# Patient Record
Sex: Female | Born: 1985 | Hispanic: No | Marital: Single | Smoking: Current every day smoker
Health system: Southern US, Community
[De-identification: ages and names within clinical notes are randomized; demographics above are authoritative.]

## PROBLEM LIST (undated history)

## (undated) ENCOUNTER — Inpatient Hospital Stay: Payer: Self-pay

## (undated) DIAGNOSIS — N83209 Unspecified ovarian cyst, unspecified side: Secondary | ICD-10-CM

## (undated) DIAGNOSIS — F319 Bipolar disorder, unspecified: Secondary | ICD-10-CM

## (undated) DIAGNOSIS — F209 Schizophrenia, unspecified: Secondary | ICD-10-CM

## (undated) DIAGNOSIS — F419 Anxiety disorder, unspecified: Secondary | ICD-10-CM

## (undated) DIAGNOSIS — D649 Anemia, unspecified: Secondary | ICD-10-CM

## (undated) DIAGNOSIS — A630 Anogenital (venereal) warts: Secondary | ICD-10-CM

---

## 2015-12-02 ENCOUNTER — Encounter: Payer: Self-pay | Admitting: Emergency Medicine

## 2015-12-02 ENCOUNTER — Emergency Department: Payer: Medicaid Other

## 2015-12-02 DIAGNOSIS — O99332 Smoking (tobacco) complicating pregnancy, second trimester: Secondary | ICD-10-CM | POA: Insufficient documentation

## 2015-12-02 DIAGNOSIS — O209 Hemorrhage in early pregnancy, unspecified: Secondary | ICD-10-CM | POA: Diagnosis present

## 2015-12-02 DIAGNOSIS — F17200 Nicotine dependence, unspecified, uncomplicated: Secondary | ICD-10-CM | POA: Diagnosis not present

## 2015-12-02 DIAGNOSIS — Z3A19 19 weeks gestation of pregnancy: Secondary | ICD-10-CM | POA: Diagnosis not present

## 2015-12-02 LAB — CBC WITH DIFFERENTIAL/PLATELET
BASOS ABS: 0.1 10*3/uL (ref 0–0.1)
BASOS PCT: 1 %
EOS ABS: 0.1 10*3/uL (ref 0–0.7)
Eosinophils Relative: 1 %
HEMATOCRIT: 32.6 % — AB (ref 35.0–47.0)
HEMOGLOBIN: 11 g/dL — AB (ref 12.0–16.0)
Lymphocytes Relative: 16 %
Lymphs Abs: 2.3 10*3/uL (ref 1.0–3.6)
MCH: 28.7 pg (ref 26.0–34.0)
MCHC: 33.7 g/dL (ref 32.0–36.0)
MCV: 85.1 fL (ref 80.0–100.0)
Monocytes Absolute: 0.7 10*3/uL (ref 0.2–0.9)
Monocytes Relative: 5 %
NEUTROS ABS: 11.6 10*3/uL — AB (ref 1.4–6.5)
NEUTROS PCT: 79 %
Platelets: 271 10*3/uL (ref 150–440)
RBC: 3.83 MIL/uL (ref 3.80–5.20)
RDW: 16.3 % — ABNORMAL HIGH (ref 11.5–14.5)
WBC: 14.7 10*3/uL — AB (ref 3.6–11.0)

## 2015-12-02 NOTE — ED Notes (Signed)
Pt arrived to the ED accompanied by a friend, for complaints of spotting and cramping. Pt reports that she is [redacted] weeks pregnant with complications; pt had previous cramping and bleeding with this pregnancy. Pt states that she has a history of miscarriages (6). Pt is teary and sad in triage.

## 2015-12-03 ENCOUNTER — Emergency Department
Admission: EM | Admit: 2015-12-03 | Discharge: 2015-12-03 | Disposition: A | Discharge status: Home / Self Care | Payer: Medicaid Other | Attending: Emergency Medicine | Admitting: Emergency Medicine

## 2015-12-03 DIAGNOSIS — N939 Abnormal uterine and vaginal bleeding, unspecified: Secondary | ICD-10-CM

## 2015-12-03 DIAGNOSIS — O4692 Antepartum hemorrhage, unspecified, second trimester: Secondary | ICD-10-CM

## 2015-12-03 HISTORY — DX: Anemia, unspecified: D64.9

## 2015-12-03 HISTORY — DX: Bipolar disorder, unspecified: F31.9

## 2015-12-03 HISTORY — DX: Anogenital (venereal) warts: A63.0

## 2015-12-03 HISTORY — DX: Schizophrenia, unspecified: F20.9

## 2015-12-03 HISTORY — DX: Anxiety disorder, unspecified: F41.9

## 2015-12-03 HISTORY — DX: Unspecified ovarian cyst, unspecified side: N83.209

## 2015-12-03 LAB — URINALYSIS COMPLETE WITH MICROSCOPIC (ARMC ONLY)
Bilirubin Urine: NEGATIVE
Glucose, UA: NEGATIVE mg/dL
Hgb urine dipstick: NEGATIVE
Ketones, ur: NEGATIVE mg/dL
Nitrite: NEGATIVE
Protein, ur: NEGATIVE mg/dL
Specific Gravity, Urine: 1.01 (ref 1.005–1.030)
pH: 7 (ref 5.0–8.0)

## 2015-12-03 LAB — HCG, QUANTITATIVE, PREGNANCY: HCG, BETA CHAIN, QUANT, S: 8387 m[IU]/mL — AB (ref <5)

## 2015-12-03 LAB — ABO/RH: ABO/RH(D): O POS

## 2015-12-03 MED ORDER — ACETAMINOPHEN 325 MG PO TABS
650.0000 mg | ORAL_TABLET | Freq: Once | ORAL | Status: AC
  Administered 2015-12-03: 650 mg via ORAL
  Filled 2015-12-03: qty 2

## 2015-12-03 NOTE — ED Provider Notes (Signed)
[facility] Emergency Department Provider Note  ____________________________________________  Time seen: 3:15 AM  I have reviewed the triage vital signs and the nursing notes.   HISTORY  Chief Complaint Vaginal Bleeding and Threatened Miscarriage     HPI Christina Hansen is a 30 y.o. female G 10 P3 presents with vaginal spotting and pelvic cramping 3 days. [facility] she missed her last appointment on Monday. Patient denies any fever no dysuria    Past Medical History  Diagnosis Date  . Anemia   . Ovarian cyst   . Anxiety   . Bipolar 1 disorder (HCC)   . Schizophrenia (HCC)   . HPV (human papilloma virus) anogenital infection     There are no active problems to display for this patient.   History reviewed. No pertinent past surgical history.  No current outpatient prescriptions on file.  Allergies Wool alcohol and Peanut-containing drug products  History reviewed. No pertinent family history.  Social History Social History  Substance Use Topics  . Smoking status: Current Every Day Smoker  . Smokeless tobacco: None  . Alcohol Use: Yes    Review of Systems  Constitutional: Negative for fever. Eyes: Negative for visual changes. ENT: Negative for sore throat. Cardiovascular: Negative for chest pain. Respiratory: Negative for shortness of breath. Gastrointestinal: Negative for abdominal pain, vomiting and diarrhea. Genitourinary: Negative for dysuria. Positive for pelvic pain and vaginal spotting Musculoskeletal: Negative for back pain. Skin: Negative for rash. Neurological: Negative for headaches, focal weakness or numbness.   10-point ROS otherwise negative.  ____________________________________________   PHYSICAL EXAM:  VITAL SIGNS: ED Triage Vitals  Enc Vitals Group     BP 12/02/15 2254 124/78 mmHg     Pulse Rate 12/02/15 2254 97     Resp 12/02/15 2254 18     Temp 12/02/15 2254 98.2 F (36.8  C)     Temp Source 12/02/15 2254 Oral     SpO2 12/02/15 2254 97 %     Weight 12/02/15 2254 193 lb (87.544 kg)     Height 12/02/15 2254 5\' 3"  (1.6 m)     Head Cir --      Peak Flow --      Pain Score 12/02/15 2255 6     Pain Loc --      Pain Edu? --      Excl. in GC? --      Constitutional: Alert and oriented. Well appearing and in no distress. Eyes: Conjunctivae are normal. PERRL. Normal extraocular movements. ENT   Head: Normocephalic and atraumatic.   Nose: No congestion/rhinnorhea.   Mouth/Throat: Mucous membranes are moist.   Neck: No stridor. Hematological/Lymphatic/Immunilogical: No cervical lymphadenopathy. Cardiovascular: Normal rate, regular rhythm. Normal and symmetric distal pulses are present in all extremities. No murmurs, rubs, or gallops. Respiratory: Normal respiratory effort without tachypnea nor retractions. Breath sounds are clear and equal bilaterally. No wheezes/rales/rhonchi. Gastrointestinal: Soft and nontender. No distention. There is no CVA tenderness. Genitourinary: deferred Musculoskeletal: Nontender with normal range of motion in all extremities. No joint effusions.  No lower extremity tenderness nor edema. Neurologic:  Normal speech and language. No gross focal neurologic deficits are appreciated. Speech is normal.  Skin:  Skin is warm, dry and intact. No rash noted. Psychiatric: Mood and affect are normal. Speech and behavior are normal. Patient exhibits appropriate insight and judgment.  ____________________________________________    LABS (pertinent positives/negatives)  Labs Reviewed  HCG, QUANTITATIVE, PREGNANCY - Abnormal; Notable for the following:  hCG, Beta Chain, Quant, S 8387 (*)    All other components within normal limits  CBC WITH DIFFERENTIAL/PLATELET - Abnormal; Notable for the following:    WBC 14.7 (*)    Hemoglobin 11.0 (*)    HCT 32.6 (*)    RDW 16.3 (*)    Neutro Abs 11.6 (*)    All other components within  normal limits  URINALYSIS COMPLETEWITH MICROSCOPIC ([facility] ONLY) - Abnormal; Notable for the following:    Color, Urine YELLOW (*)    APPearance HAZY (*)    Leukocytes, UA 1+ (*)    Bacteria, UA FEW (*)    Squamous Epithelial / LPF 0-5 (*)    All other components within normal limits  ABO/RH      RADIOLOGY  US OB Limited (Final result) Result time: 12/02/15 23:57:31   Procedure changed from US Ob Comp + 14 Wk      Final result by Rad Results In Interface (12/02/15 23:57:31)   Narrative:   CLINICAL DATA: Pelvic cramping for 2 weeks. Vaginal bleeding.  EXAM: LIMITED OBSTETRIC ULTRASOUND  FINDINGS: Number of Fetuses: 1  Heart Rate: 155 bpm  Movement: Yes  Presentation: Transverse, head maternal right  Placental Location: Anterior fundal.  Previa: No  Amniotic Fluid (Subjective): Within normal limits.  BPD: 4.2cm 18w 6d  MATERNAL FINDINGS:  Cervix: Appears closed.  Uterus/Adnexae: No abnormality visualized.  IMPRESSION: Single live intrauterine pregnancy estimated gestational age [redacted] weeks 6 day for estimated date of delivery 04/28/2016.  This exam is performed on an emergent basis and does not comprehensively evaluate fetal size, dating, or anatomy; follow-up complete OB US should be considered if further fetal assessment is warranted.   Electronically Signed By: Rubye Oaks M.D. On: 12/02/2015 23:57        INITIAL IMPRESSION / ASSESSMENT AND PLAN / ED COURSE  Pertinent labs & imaging results that were available during my care of the patient were reviewed by me and considered in my medical decision making (see chart for details).   patient's blood type O positive.   ____________________________________________   FINAL CLINICAL IMPRESSION(S) / ED DIAGNOSES  Final diagnoses:  Vaginal bleeding in pregnancy, second trimester      Darci Current, MD 12/03/15 [phone]

## 2015-12-03 NOTE — Discharge Instructions (Signed)
Vaginal Bleeding During Pregnancy, Second Trimester A small amount of bleeding (spotting) from the vagina is relatively common in pregnancy. It usually stops on its own. Various things can cause bleeding or spotting in pregnancy. Some bleeding may be related to the pregnancy, and some may not. Sometimes the bleeding is normal and is not a problem. However, bleeding can also be a sign of something serious. Be sure to tell your health care provider about any vaginal bleeding right away. Some possible causes of vaginal bleeding during the second trimester include:  Infection, inflammation, or growths on the cervix.   The placenta may be partially or completely covering the opening of the cervix inside the uterus (placenta previa).  The placenta may have separated from the uterus (abruption of the placenta).   You may be having early (preterm) labor.   The cervix may not be strong enough to keep a baby inside the uterus (cervical insufficiency).   Tiny cysts may have developed in the uterus instead of pregnancy tissue (molar pregnancy). HOME CARE INSTRUCTIONS  Watch your condition for any changes. The following actions may help to lessen any discomfort you are feeling:  Follow your health care provider's instructions for limiting your activity. If your health care provider orders bed rest, you may need to stay in bed and only get up to use the bathroom. However, your health care provider may allow you to continue light activity.  If needed, make plans for someone to help with your regular activities and responsibilities while you are on bed rest.  Keep track of the number of pads you use each day, how often you change pads, and how soaked (saturated) they are. Write this down.  Do not use tampons. Do not douche.  Do not have sexual intercourse or orgasms until approved by your health care provider.  If you pass any tissue from your vagina, save the tissue so you can show it to your  health care provider.  Only take over-the-counter or prescription medicines as directed by your health care provider.  Do not take aspirin because it can make you bleed.  Do not exercise or perform any strenuous activities or heavy lifting without your health care provider's permission.  Keep all follow-up appointments as directed by your health care provider. SEEK MEDICAL CARE IF:  You have any vaginal bleeding during any part of your pregnancy.  You have cramps or labor pains.  You have a fever, not controlled by medicine. SEEK IMMEDIATE MEDICAL CARE IF:   You have severe cramps in your back or belly (abdomen).  You have contractions.  You have chills.  You pass large clots or tissue from your vagina.  Your bleeding increases.  You feel light-headed or weak, or you have fainting episodes.  You are leaking fluid or have a gush of fluid from your vagina. MAKE SURE YOU:  Understand these instructions.  Will watch your condition.  Will get help right away if you are not doing well or get worse.   This information is not intended to replace advice given to you by your health care provider. Make sure you discuss any questions you have with your health care provider.   Document Released: 08/25/2005 Document Revised: 11/20/2013 Document Reviewed: 07/23/2013 Elsevier Interactive Patient Education 2016 Elsevier Inc.  

## 2015-12-05 ENCOUNTER — Ambulatory Visit
Admission: RE | Admit: 2015-12-05 | Discharge: 2015-12-05 | Disposition: A | Discharge status: Home / Self Care | Payer: Medicaid Other | Source: Ambulatory Visit | Attending: Advanced Practice Midwife | Admitting: Advanced Practice Midwife

## 2015-12-05 ENCOUNTER — Other Ambulatory Visit: Payer: Self-pay | Admitting: Advanced Practice Midwife

## 2015-12-05 DIAGNOSIS — R1032 Left lower quadrant pain: Secondary | ICD-10-CM | POA: Diagnosis present

## 2015-12-05 DIAGNOSIS — K760 Fatty (change of) liver, not elsewhere classified: Secondary | ICD-10-CM | POA: Diagnosis not present

## 2015-12-05 DIAGNOSIS — Z3A19 19 weeks gestation of pregnancy: Secondary | ICD-10-CM | POA: Diagnosis not present

## 2015-12-05 DIAGNOSIS — O26892 Other specified pregnancy related conditions, second trimester: Secondary | ICD-10-CM | POA: Insufficient documentation

## 2015-12-19 ENCOUNTER — Observation Stay
Admission: EM | Admit: 2015-12-19 | Discharge: 2015-12-20 | Disposition: A | Discharge status: Home / Self Care | Payer: Medicaid Other | Attending: Obstetrics & Gynecology | Admitting: Obstetrics & Gynecology

## 2015-12-19 ENCOUNTER — Observation Stay: Payer: Medicaid Other

## 2015-12-19 DIAGNOSIS — O99334 Smoking (tobacco) complicating childbirth: Secondary | ICD-10-CM | POA: Insufficient documentation

## 2015-12-19 DIAGNOSIS — O99212 Obesity complicating pregnancy, second trimester: Secondary | ICD-10-CM | POA: Insufficient documentation

## 2015-12-19 DIAGNOSIS — Z91048 Other nonmedicinal substance allergy status: Secondary | ICD-10-CM | POA: Diagnosis not present

## 2015-12-19 DIAGNOSIS — Z888 Allergy status to other drugs, medicaments and biological substances status: Secondary | ICD-10-CM | POA: Diagnosis not present

## 2015-12-19 DIAGNOSIS — E669 Obesity, unspecified: Secondary | ICD-10-CM | POA: Diagnosis not present

## 2015-12-19 DIAGNOSIS — O26612 Liver and biliary tract disorders in pregnancy, second trimester: Secondary | ICD-10-CM | POA: Insufficient documentation

## 2015-12-19 DIAGNOSIS — R102 Pelvic and perineal pain: Secondary | ICD-10-CM | POA: Insufficient documentation

## 2015-12-19 DIAGNOSIS — F17210 Nicotine dependence, cigarettes, uncomplicated: Secondary | ICD-10-CM | POA: Insufficient documentation

## 2015-12-19 DIAGNOSIS — Z3A21 21 weeks gestation of pregnancy: Secondary | ICD-10-CM | POA: Insufficient documentation

## 2015-12-19 DIAGNOSIS — K760 Fatty (change of) liver, not elsewhere classified: Secondary | ICD-10-CM | POA: Diagnosis not present

## 2015-12-19 DIAGNOSIS — O26892 Other specified pregnancy related conditions, second trimester: Principal | ICD-10-CM | POA: Insufficient documentation

## 2015-12-19 DIAGNOSIS — R309 Painful micturition, unspecified: Secondary | ICD-10-CM

## 2015-12-19 LAB — URINALYSIS COMPLETE WITH MICROSCOPIC (ARMC ONLY)
Bilirubin Urine: NEGATIVE
GLUCOSE, UA: NEGATIVE mg/dL
Hgb urine dipstick: NEGATIVE
Ketones, ur: NEGATIVE mg/dL
NITRITE: NEGATIVE
Protein, ur: NEGATIVE mg/dL
Specific Gravity, Urine: 1.005 (ref 1.005–1.030)
pH: 7 (ref 5.0–8.0)

## 2015-12-19 MED ORDER — ACETAMINOPHEN 325 MG PO TABS
650.0000 mg | ORAL_TABLET | ORAL | Status: DC | PRN
  Administered 2015-12-19: 650 mg via ORAL
  Filled 2015-12-19: qty 2

## 2015-12-19 MED ORDER — OXYBUTYNIN CHLORIDE ER 5 MG PO TB24
5.0000 mg | ORAL_TABLET | Freq: Every day | ORAL | Status: DC
  Administered 2015-12-19: 5 mg via ORAL
  Filled 2015-12-19 (×2): qty 1

## 2015-12-19 MED ORDER — OXYBUTYNIN CHLORIDE ER 5 MG PO TB24: 5.0000 mg | ORAL_TABLET | Freq: Every day | ORAL | Status: DC

## 2015-12-19 MED ORDER — OXYCODONE-ACETAMINOPHEN 5-325 MG PO TABS
1.0000 | ORAL_TABLET | ORAL | Status: DC | PRN
  Administered 2015-12-19 – 2015-12-20 (×2): 1 via ORAL
  Filled 2015-12-19 (×2): qty 1

## 2015-12-19 NOTE — Plan of Care (Signed)
Pt presents to l/d with c/o pink spotting and has gotten progressively worse over the last month. Dr ward aware that pt is on the unit.

## 2015-12-20 DIAGNOSIS — O26892 Other specified pregnancy related conditions, second trimester: Secondary | ICD-10-CM | POA: Diagnosis not present

## 2015-12-20 MED ORDER — OXYCODONE-ACETAMINOPHEN 5-325 MG PO TABS: 1.0000 | ORAL_TABLET | Freq: Four times a day (QID) | ORAL | Status: AC | PRN

## 2015-12-20 NOTE — Discharge Summary (Signed)
Christina Hansen is a 30 y.o. female. She is at [redacted]w[redacted]d gestation.  Indication: Christina Hansen is a [id number] who presents again to triage with continued complaints of pelvic pain.  As she describes it, it is along the front of the pelvis, bilaterally, and traces laterally, up her flank, and ends in her mid-back on both sides.  She also complains of a spot of blood.  She has been evaluated for this for over a month, and has had continuous negative urine cultures, s/p antibiotic treatment, s/p renal and obstetric ultrasounds, and is thus far without any pertinent findings.   She does not know if her bleeding is from her vagina or urethra.  She further describes pain with urination, feeling that when her bladder empties she feels like her bladder is "bruised".    Pregnancy issues:  1. Obesity 2. Smoker 3. Fatty liver (seen on ultrasound) 4. Chronic urinary system pain 5. FOB is Hep B positive, patient initial Hep B Ag neg  S: Resting comfortably. no CTX, no VB. Active fetal movement.  O:  BP 120/73 mmHg  Pulse 88  Temp(Src) 98.4 F (36.9 C)  LMP 07/21/2015 (Approximate) Results for orders placed or performed during the hospital encounter of 12/19/15 (from the past 48 hour(s))  Urinalysis complete, with microscopic [facility] only)   Collection Time: 12/19/15  8:08 PM  Result Value Ref Range   Color, Urine YELLOW (A) YELLOW   APPearance HAZY (A) CLEAR   Glucose, UA NEGATIVE NEGATIVE mg/dL   Bilirubin Urine NEGATIVE NEGATIVE   Ketones, ur NEGATIVE NEGATIVE mg/dL   Specific Gravity, Urine 1.005 1.005 - 1.030   Hgb urine dipstick NEGATIVE NEGATIVE   pH 7.0 5.0 - 8.0   Protein, ur NEGATIVE NEGATIVE mg/dL   Nitrite NEGATIVE NEGATIVE   Leukocytes, UA 3+ (A) NEGATIVE   RBC / HPF 0-5 0 - 5 RBC/hpf   WBC, UA 0-5 0 - 5 WBC/hpf   Bacteria, UA MANY (A) NONE SEEN   Squamous Epithelial / LPF 0-5 (A) NONE SEEN   Mucous PRESENT      Gen: NAD, AAOx3      Abd: FNTTP      Ext: Non-tender, Nonedmeatous Back:   CVA ttp on RIGHT Pelvic: SSE: copious thick white discharge, no evidence of bleeding.  Cervix without lesions.  External os 1cm visually.  No caruncle or lesion on urethra.  No hemorrhoids or lesions on anus.  SVE: TTP bilateral lateral bladder and at site of UOs.  Dome of bladder non-tender.  Cervix non-tender.  External cervical os dilated, able to pass a finger, but unable to palpate internal os.  Long and firm.      FHT: present and WNL TOCO: quiet SVE: external os multiparous, endocervical canal patent, but internal os closed.   A/P:  [id number] with chronic, unidentified GU pain, and [redacted] weeks pregnant   Labor: not present.   Had a repeat anatomy scan yesterday; placenta is fundal and not associated with the bladder.  Test for funneling not performed, no comments on cervix.  Urine was strained overnight and no precipitate noted.  Renal ultrasound shows no hydronephrosis, bladder distortion, no abnormal findings.  Ditropan given with no change in symptoms  Percocet masks pain and gives relief.  Rx'd #45 5/325mg .    Will refer to urology for help in management/identification of source.  D/c home stable, precautions reviewed, follow-up as scheduled.   Christina Hansen, Elenora Fender

## 2016-02-19 ENCOUNTER — Emergency Department (HOSPITAL_COMMUNITY)
Admission: EM | Admit: 2016-02-19 | Discharge: 2016-02-19 | Disposition: A | Discharge status: Home / Self Care | Payer: Medicaid Other | Attending: Emergency Medicine | Admitting: Emergency Medicine

## 2016-02-19 ENCOUNTER — Emergency Department (HOSPITAL_COMMUNITY): Payer: Medicaid Other

## 2016-02-19 ENCOUNTER — Encounter (HOSPITAL_COMMUNITY): Payer: Self-pay

## 2016-02-19 DIAGNOSIS — O99333 Smoking (tobacco) complicating pregnancy, third trimester: Secondary | ICD-10-CM | POA: Insufficient documentation

## 2016-02-19 DIAGNOSIS — Z3A28 28 weeks gestation of pregnancy: Secondary | ICD-10-CM | POA: Diagnosis not present

## 2016-02-19 DIAGNOSIS — F17200 Nicotine dependence, unspecified, uncomplicated: Secondary | ICD-10-CM | POA: Insufficient documentation

## 2016-02-19 DIAGNOSIS — F319 Bipolar disorder, unspecified: Secondary | ICD-10-CM | POA: Diagnosis not present

## 2016-02-19 DIAGNOSIS — O99513 Diseases of the respiratory system complicating pregnancy, third trimester: Secondary | ICD-10-CM | POA: Diagnosis not present

## 2016-02-19 DIAGNOSIS — J069 Acute upper respiratory infection, unspecified: Secondary | ICD-10-CM

## 2016-02-19 MED ORDER — DEXTROMETHORPHAN POLISTIREX ER 30 MG/5ML PO SUER
30.0000 mg | Freq: Once | ORAL | Status: AC
  Administered 2016-02-19: 30 mg via ORAL
  Filled 2016-02-19: qty 5

## 2016-02-19 MED ORDER — ACETAMINOPHEN 500 MG PO TABS
1000.0000 mg | ORAL_TABLET | Freq: Once | ORAL | Status: AC
  Administered 2016-02-19: 1000 mg via ORAL
  Filled 2016-02-19: qty 2

## 2016-02-19 MED ORDER — GUAIFENESIN 200 MG PO TABS: 200.0000 mg | ORAL_TABLET | ORAL | Status: DC | PRN

## 2016-02-19 NOTE — ED Notes (Signed)
Pt given cup of water and encouraged to drink;  

## 2016-02-19 NOTE — ED Notes (Signed)
CIWA scale performed on wrong pt; assessment corrected and CIWA performed on correct patient

## 2016-02-19 NOTE — Discharge Instructions (Signed)
You may take Tylenol 1000 mg every 6 hours as needed for pain and fever.   Upper Respiratory Infection, Adult Most upper respiratory infections (URIs) are a viral infection of the air passages leading to the lungs. A URI affects the nose, throat, and upper air passages. The most common type of URI is nasopharyngitis and is typically referred to as "the common cold." URIs run their course and usually go away on their own. Most of the time, a URI does not require medical attention, but sometimes a bacterial infection in the upper airways can follow a viral infection. This is called a secondary infection. Sinus and middle ear infections are common types of secondary upper respiratory infections. Bacterial pneumonia can also complicate a URI. A URI can worsen asthma and chronic obstructive pulmonary disease (COPD). Sometimes, these complications can require emergency medical care and may be life threatening.  CAUSES Almost all URIs are caused by viruses. A virus is a type of germ and can spread from one person to another.  RISKS FACTORS You may be at risk for a URI if:   You smoke.   You have chronic heart or lung disease.  You have a weakened defense (immune) system.   You are very young or very old.   You have nasal allergies or asthma.  You work in crowded or poorly ventilated areas.  You work in health care facilities or schools. SIGNS AND SYMPTOMS  Symptoms typically develop 2-3 days after you come in contact with a cold virus. Most viral URIs last 7-10 days. However, viral URIs from the influenza virus (flu virus) can last 14-18 days and are typically more severe. Symptoms may include:   Runny or stuffy (congested) nose.   Sneezing.   Cough.   Sore throat.   Headache.   Fatigue.   Fever.   Loss of appetite.   Pain in your forehead, behind your eyes, and over your cheekbones (sinus pain).  Muscle aches.  DIAGNOSIS  Your health care provider may diagnose a  URI by:  Physical exam.  Tests to check that your symptoms are not due to another condition such as:  Strep throat.  Sinusitis.  Pneumonia.  Asthma. TREATMENT  A URI goes away on its own with time. It cannot be cured with medicines, but medicines may be prescribed or recommended to relieve symptoms. Medicines may help:  Reduce your fever.  Reduce your cough.  Relieve nasal congestion. HOME CARE INSTRUCTIONS   Take medicines only as directed by your health care provider.   Gargle warm saltwater or take cough drops to comfort your throat as directed by your health care provider.  Use a warm mist humidifier or inhale steam from a shower to increase air moisture. This may make it easier to breathe.  Drink enough fluid to keep your urine clear or pale yellow.   Eat soups and other clear broths and maintain good nutrition.   Rest as needed.   Return to work when your temperature has returned to normal or as your health care provider advises. You may need to stay home longer to avoid infecting others. You can also use a face mask and careful hand washing to prevent spread of the virus.  Increase the usage of your inhaler if you have asthma.   Do not use any tobacco products, including cigarettes, chewing tobacco, or electronic cigarettes. If you need help quitting, ask your health care provider. PREVENTION  The best way to protect yourself from getting a  cold is to practice good hygiene.   Avoid oral or hand contact with people with cold symptoms.   Wash your hands often if contact occurs.  There is no clear evidence that vitamin C, vitamin E, echinacea, or exercise reduces the chance of developing a cold. However, it is always recommended to get plenty of rest, exercise, and practice good nutrition.  SEEK MEDICAL CARE IF:   You are getting worse rather than better.   Your symptoms are not controlled by medicine.   You have chills.  You have worsening  shortness of breath.  You have brown or red mucus.  You have yellow or brown nasal discharge.  You have pain in your face, especially when you bend forward.  You have a fever.  You have swollen neck glands.  You have pain while swallowing.  You have white areas in the back of your throat. SEEK IMMEDIATE MEDICAL CARE IF:   You have severe or persistent:  Headache.  Ear pain.  Sinus pain.  Chest pain.  You have chronic lung disease and any of the following:  Wheezing.  Prolonged cough.  Coughing up blood.  A change in your usual mucus.  You have a stiff neck.  You have changes in your:  Vision.  Hearing.  Thinking.  Mood. MAKE SURE YOU:   Understand these instructions.  Will watch your condition.  Will get help right away if you are not doing well or get worse.   This information is not intended to replace advice given to you by your health care provider. Make sure you discuss any questions you have with your health care provider.   Document Released: 05/11/2001 Document Revised: 04/01/2015 Document Reviewed: 02/20/2014 Elsevier Interactive Patient Education [employer]2016 Elsevier Inc.

## 2016-02-19 NOTE — ED Notes (Signed)
Cough onset yesterday, also c/o headache.

## 2016-02-19 NOTE — ED Provider Notes (Signed)
74  and manner are appropriate. Grooming and personal hygiene are appropriate.  MEDICAL DECISION MAKING: Patient here with likely viral illness. Chest x-ray shows no infiltrate. Patient feels better after Tylenol, extra before feeding. Discussed with patient that a other medications aren't safe in  pregnancy. Do not feel she needs to be on antibiotics at this time. She is drinking without difficulty. Have recommended supportive care instructions, rest, increase fluid intake. Discussed return precautions. She verbalizes understanding and is comfortable with this plan.     Patient had an erroneous heart rate documented. Heart rate of 127 did not belong to this patient.    Layla MawKristen N Tahjae Clausing, DO 02/19/16 [phone]

## 2016-03-16 ENCOUNTER — Encounter (HOSPITAL_COMMUNITY): Payer: Self-pay | Admitting: *Deleted

## 2016-03-16 ENCOUNTER — Emergency Department (HOSPITAL_COMMUNITY)
Admission: EM | Admit: 2016-03-16 | Discharge: 2016-03-17 | Disposition: A | Discharge status: Home / Self Care | Payer: Medicaid Other | Attending: Emergency Medicine | Admitting: Emergency Medicine

## 2016-03-16 DIAGNOSIS — O26893 Other specified pregnancy related conditions, third trimester: Secondary | ICD-10-CM | POA: Diagnosis present

## 2016-03-16 DIAGNOSIS — F319 Bipolar disorder, unspecified: Secondary | ICD-10-CM | POA: Diagnosis not present

## 2016-03-16 DIAGNOSIS — B3731 Acute candidiasis of vulva and vagina: Secondary | ICD-10-CM

## 2016-03-16 DIAGNOSIS — Z3A34 34 weeks gestation of pregnancy: Secondary | ICD-10-CM | POA: Insufficient documentation

## 2016-03-16 DIAGNOSIS — O26899 Other specified pregnancy related conditions, unspecified trimester: Secondary | ICD-10-CM

## 2016-03-16 DIAGNOSIS — F209 Schizophrenia, unspecified: Secondary | ICD-10-CM | POA: Insufficient documentation

## 2016-03-16 DIAGNOSIS — B9689 Other specified bacterial agents as the cause of diseases classified elsewhere: Secondary | ICD-10-CM

## 2016-03-16 DIAGNOSIS — N760 Acute vaginitis: Secondary | ICD-10-CM

## 2016-03-16 DIAGNOSIS — R1030 Lower abdominal pain, unspecified: Secondary | ICD-10-CM | POA: Diagnosis not present

## 2016-03-16 DIAGNOSIS — R109 Unspecified abdominal pain: Secondary | ICD-10-CM

## 2016-03-16 DIAGNOSIS — F17200 Nicotine dependence, unspecified, uncomplicated: Secondary | ICD-10-CM | POA: Diagnosis not present

## 2016-03-16 DIAGNOSIS — R809 Proteinuria, unspecified: Secondary | ICD-10-CM

## 2016-03-16 DIAGNOSIS — B373 Candidiasis of vulva and vagina: Secondary | ICD-10-CM

## 2016-03-16 LAB — URINALYSIS, ROUTINE W REFLEX MICROSCOPIC
BILIRUBIN URINE: NEGATIVE
Glucose, UA: NEGATIVE mg/dL
Hgb urine dipstick: NEGATIVE
KETONES UR: NEGATIVE mg/dL
Leukocytes, UA: NEGATIVE
NITRITE: NEGATIVE
PH: 6.5 (ref 5.0–8.0)
PROTEIN: 30 mg/dL — AB
Specific Gravity, Urine: 1.025 (ref 1.005–1.030)

## 2016-03-16 LAB — URINE MICROSCOPIC-ADD ON

## 2016-03-16 MED ORDER — ACETAMINOPHEN 500 MG PO TABS
1000.0000 mg | ORAL_TABLET | Freq: Once | ORAL | Status: AC
  Administered 2016-03-16: 1000 mg via ORAL
  Filled 2016-03-16: qty 2

## 2016-03-16 NOTE — ED Notes (Addendum)
Pt c/o lower abd pain that is worse with walking, standing that started 6 days ago, pt reports that her obgyn is in [place] but she has moved to the area and does not have a new obgyn, reports that this is pt's 4th pregnancy, denies any complications, pt also reports that she has had a green vaginal discharge,

## 2016-03-16 NOTE — ED Provider Notes (Signed)
By signing my name below, I, Iona Beard, attest that this documentation has been prepared under the direction and in the presence of Simrat Kendrick N Lemon Sternberg, DO.   Electronically Signed: Iona Beard, ED Scribe. 03/17/2016. 1:00 AM  TIME SEEN: 11:17 PM  CHIEF COMPLAINT: Abdominal Pain  HPI: Christina Hansen is a 30 y.o. female with history of bipolar disorder, schizophrenia who presents to the Emergency Department complaining of gradual onset, lower abdominal pain, ongoing for five days. She notes associated green vaginal discharge, onset one week ago. Pt is currently pregnant but is new to the area and does not have an OBGYN. No other associated symptoms noted. She states that her pain is worsened when she is standing or walking. No other worsening or alleviating factors noted. Pt denies injury, vaginal bleeding, vaginal fluid leaking, fevers, chills, nausea, vomiting, diarrhea, dysuria, contractions, or any other pertinent symptoms. States that she is feeling her baby move. [id number].  She is not currently sexually active. States last sexual intercourse was 2 months ago. Her due date is Apr 26, 2016. Patient is [redacted] weeks pregnant and 1 day based on her due date.   ROS: See HPI Constitutional: no fever  Eyes: no drainage  ENT: no runny nose   Cardiovascular:  no chest pain  Resp: no SOB  GI: no vomiting GU: no dysuria Integumentary: no rash  Allergy: no hives  Musculoskeletal: no leg swelling  Neurological: no slurred speech ROS otherwise negative  PAST MEDICAL HISTORY/PAST SURGICAL HISTORY:  Past Medical History  Diagnosis Date  . Anemia   . Ovarian cyst   . Anxiety   . Bipolar 1 disorder (HCC)   . Schizophrenia (HCC)   . HPV (human papilloma virus) anogenital infection     MEDICATIONS:  Prior to Admission medications   Medication Sig Start Date End Date Taking? Authorizing Provider  oxyCODONE-acetaminophen (PERCOCET/ROXICET) 5-325 MG tablet Take 1 tablet by mouth every 6 (six)  hours as needed for severe pain. 12/20/15   Elenora Fender Littleton Haub, MD    ALLERGIES:  Allergies  Allergen Reactions  . Wool Alcohol [Lanolin] Hives  . Peanut-Containing Drug Products Anxiety    SOCIAL HISTORY:  Social History  Substance Use Topics  . Smoking status: Current Every Day Smoker  . Smokeless tobacco: Not on file  . Alcohol Use: Yes    FAMILY HISTORY: No family history on file.  EXAM: BP 112/73 mmHg  Pulse 98  Temp(Src) 98.2 F (36.8 C) (Oral)  Resp 20  Ht  (1.6 m)  Wt 195 lb 8 oz (88.678 kg)  BMI 34.64 kg/m2  SpO2 100%  LMP 07/21/2015 (Approximate) CONSTITUTIONAL: Alert and oriented and responds appropriately to questions. Well-appearing; well-nourished HEAD: Normocephalic EYES: Conjunctivae clear, PERRL ENT: normal nose; no rhinorrhea; moist mucous membranes; pharynx without lesions noted;  NECK: Supple, no meningismus, no LAD  CARD: RRR; S1 and S2 appreciated; no murmurs, no clicks, no rubs, no gallops RESP: Normal chest excursion without splinting or tachypnea; breath sounds clear and equal bilaterally; no wheezes, no rhonchi, no rales,  ABD/GI: Normal bowel sounds; soft, non-tender, no rebound, no guarding; gravid uterus approximately 14 cm  GU:  Normal external genitalia. No lesions, rashes noted. Patient has no vaginal bleeding on exam. Thick white vaginal discharge.  No adnexal tenderness, mass or fullness, no cervical motion tenderness. Cervix is not appear friable.  Cervix is closed.  Chaperone present for exam. BACK:  The back appears normal and is non-tender to palpation, there is no CVA tenderness  EXT: Normal ROM in all joints; non-tender to palpation; no edema; normal capillary refill; no cyanosis    SKIN: Normal color for age and race; warm NEURO: Moves all extremities equally PSYCH: The patient's mood and manner are appropriate. Grooming and personal hygiene are appropriate.  MEDICAL DECISION MAKING: Patient who is [redacted] weeks pregnant with lower  abdominal pain. Abdominal exam is benign. We will monitor her baby in the emergency department and discussed with OB nurse. We'll obtain urine, pelvic cultures. Will give Tylenol for her pain and encourage oral fluids. She states she is not feeling any contractions.  ED PROGRESS: Wet prep is positive for yeast and clue cells. We have treated past. We'll discharge with Flagyl. Urine shows no infection but she does have a small amount of protein. I have checked her labs and today they are unremarkable and near her baseline. Normal platelets, normal LFTs. Her blood pressure today has been normal. Have discussed the finding of proteinuria with patient and recommended close OB/GYN follow-up. We will have her follow-up with family treatment also give her information for OB[place]GYN's in [place] as well. She has been monitored for several hours and case was discussed with OB[facility]GYN physician [facility] and they feel she is safe to be discharged. I feel that her pain is likely round ligament pain. I do not think that she has appendicitis, UTI, pyelonephritis, colitis, diverticulitis or any other life-threatening illness. Her pelvic exam was unremarkable other than thick white vaginal discharge which is likely secondary to BV yeast which we are treating.   At this time, I do not feel there is any life-threatening condition present. I have reviewed and discussed all results (EKG, imaging, lab, urine as appropriate), exam findings with patient. I have reviewed nursing notes and appropriate previous records.  I feel the patient is safe to be discharged home without further emergent workup. Discussed usual and customary return precautions. Patient and family (if present) verbalize understanding and are comfortable with this plan.  Patient will follow-up with their primary care provider. If they do not have a primary care provider, information for follow-up has been provided to them. All questions have been  answered.   I personally performed the services described in this documentation, which was scribed in my presence. The recorded information has been reviewed and is accurate.      Layla MawKristen N Jennessy Sandridge, DO 03/17/16 [id number]

## 2016-03-17 LAB — WET PREP, GENITAL
SPERM: NONE SEEN
TRICH WET PREP: NONE SEEN

## 2016-03-17 LAB — CBC WITH DIFFERENTIAL/PLATELET
BASOS ABS: 0 10*3/uL (ref 0.0–0.1)
Basophils Relative: 0 %
EOS PCT: 2 %
Eosinophils Absolute: 0.3 10*3/uL (ref 0.0–0.7)
HCT: 28.1 % — ABNORMAL LOW (ref 36.0–46.0)
Hemoglobin: 9.4 g/dL — ABNORMAL LOW (ref 12.0–15.0)
LYMPHS ABS: 2.6 10*3/uL (ref 0.7–4.0)
LYMPHS PCT: 19 %
MCH: 28.1 pg (ref 26.0–34.0)
MCHC: 33.5 g/dL (ref 30.0–36.0)
MCV: 83.9 fL (ref 78.0–100.0)
MONO ABS: 0.8 10*3/uL (ref 0.1–1.0)
MONOS PCT: 6 %
Neutro Abs: 10.4 10*3/uL — ABNORMAL HIGH (ref 1.7–7.7)
Neutrophils Relative %: 73 %
PLATELETS: 254 10*3/uL (ref 150–400)
RBC: 3.35 MIL/uL — ABNORMAL LOW (ref 3.87–5.11)
RDW: 15 % (ref 11.5–15.5)
WBC: 14.1 10*3/uL — ABNORMAL HIGH (ref 4.0–10.5)

## 2016-03-17 LAB — COMPREHENSIVE METABOLIC PANEL
ALT: 16 U/L (ref 14–54)
ANION GAP: 9 (ref 5–15)
AST: 17 U/L (ref 15–41)
Albumin: 3 g/dL — ABNORMAL LOW (ref 3.5–5.0)
Alkaline Phosphatase: 105 U/L (ref 38–126)
BILIRUBIN TOTAL: 0.1 mg/dL — AB (ref 0.3–1.2)
BUN: 9 mg/dL (ref 6–20)
CHLORIDE: 105 mmol/L (ref 101–111)
CO2: 21 mmol/L — ABNORMAL LOW (ref 22–32)
Calcium: 8.4 mg/dL — ABNORMAL LOW (ref 8.9–10.3)
Creatinine, Ser: 0.5 mg/dL (ref 0.44–1.00)
Glucose, Bld: 93 mg/dL (ref 65–99)
POTASSIUM: 3.6 mmol/L (ref 3.5–5.1)
Sodium: 135 mmol/L (ref 135–145)
TOTAL PROTEIN: 6.8 g/dL (ref 6.5–8.1)

## 2016-03-17 MED ORDER — METRONIDAZOLE 500 MG PO TABS: 500.0000 mg | ORAL_TABLET | Freq: Two times a day (BID) | ORAL | Status: AC

## 2016-03-17 MED ORDER — FLUCONAZOLE 100 MG PO TABS
150.0000 mg | ORAL_TABLET | Freq: Once | ORAL | Status: AC
  Administered 2016-03-17: 150 mg via ORAL
  Filled 2016-03-17: qty 2

## 2016-03-17 MED ORDER — METRONIDAZOLE 500 MG PO TABS
500.0000 mg | ORAL_TABLET | Freq: Once | ORAL | Status: AC
  Administered 2016-03-17: 500 mg via ORAL
  Filled 2016-03-17: qty 1

## 2016-03-17 NOTE — ED Notes (Signed)
OB Rapid response nurse called at this time to state fetal heart rate declined to 90's for approx 1 minute. MD Ward notified by OB RR nurse.

## 2016-03-17 NOTE — Progress Notes (Signed)
Fetal monitoring discontinued after 30 minutes observation. Fetal heart tones 135bpm, multiple accelerations, no decelerations with good variability. No contractions seen.

## 2016-03-17 NOTE — Progress Notes (Signed)
Pt is a G10P3, @34wk2da , recently moved to area and has not established local prenatal care. FHT 140, positive accelerations and good variability, with one mild variable. Notified Dr. Shawnie PonsPratt of patients c/o abdominal pain and green discharge, + clue cells and yeast, treatment with diflucan and flagyl. At 0051, there was a decrease in fetal heart tones to 85 to 100 bpm, with a gradual return to baseline to 140bpm. Advised Dr. Elesa MassedWard I would like to watch tracing for another 30 minutes.

## 2016-03-17 NOTE — Discharge Instructions (Signed)
You may take Tylenol 1000 mg every 6 hours as needed for pain.  Please make an appointment with an OBGYN.  You did have a small amount of protein in your urine but normal labs and normal blood pressure.  This should be followed closely by an OBGYN.   Bacterial Vaginosis Bacterial vaginosis is a vaginal infection that occurs when the normal balance of bacteria in the vagina is disrupted. It results from an overgrowth of certain bacteria. This is the most common vaginal infection in women of childbearing age. Treatment is important to prevent complications, especially in pregnant women, as it can cause a premature delivery. CAUSES  Bacterial vaginosis is caused by an increase in harmful bacteria that are normally present in smaller amounts in the vagina. Several different kinds of bacteria can cause bacterial vaginosis. However, the reason that the condition develops is not fully understood. RISK FACTORS Certain activities or behaviors can put you at an increased risk of developing bacterial vaginosis, including:  Having a new sex partner or multiple sex partners.  Douching.  Using an intrauterine device (IUD) for contraception. Women do not get bacterial vaginosis from toilet seats, bedding, swimming pools, or contact with objects around them. SIGNS AND SYMPTOMS  Some women with bacterial vaginosis have no signs or symptoms. Common symptoms include:  Grey vaginal discharge.  A fishlike odor with discharge, especially after sexual intercourse.  Itching or burning of the vagina and vulva.  Burning or pain with urination. DIAGNOSIS  Your health care provider will take a medical history and examine the vagina for signs of bacterial vaginosis. A sample of vaginal fluid may be taken. Your health care provider will look at this sample under a microscope to check for bacteria and abnormal cells. A vaginal pH test may also be done.  TREATMENT  Bacterial vaginosis may be treated with antibiotic  medicines. These may be given in the form of a pill or a vaginal cream. A second round of antibiotics may be prescribed if the condition comes back after treatment. Because bacterial vaginosis increases your risk for sexually transmitted diseases, getting treated can help reduce your risk for chlamydia, gonorrhea, HIV, and herpes. HOME CARE INSTRUCTIONS   Only take over-the-counter or prescription medicines as directed by your health care provider.  If antibiotic medicine was prescribed, take it as directed. Make sure you finish it even if you start to feel better.  Tell all sexual partners that you have a vaginal infection. They should see their health care provider and be treated if they have problems, such as a mild rash or itching.  During treatment, it is important that you follow these instructions:  Avoid sexual activity or use condoms correctly.  Do not douche.  Avoid alcohol as directed by your health care provider.  Avoid breastfeeding as directed by your health care provider. SEEK MEDICAL CARE IF:   Your symptoms are not improving after 3 days of treatment.  You have increased discharge or pain.  You have a fever. MAKE SURE YOU:   Understand these instructions.  Will watch your condition.  Will get help right away if you are not doing well or get worse. FOR MORE INFORMATION  Centers for Disease Control and Prevention, Division of STD Prevention: SolutionApps.co.za American Sexual Health Association (ASHA): www.ashastd.org    This information is not intended to replace advice given to you by your health care provider. Make sure you discuss any questions you have with your health care provider.   Document Released:  11/15/2005 Document Revised: 12/06/2014 Document Reviewed: 06/27/2013 Elsevier Interactive Patient Education 2016 Elsevier Inc.  Monilial Vaginitis Vaginitis in a soreness, swelling and redness (inflammation) of the vagina and vulva. Monilial vaginitis is  not a sexually transmitted infection. CAUSES  Yeast vaginitis is caused by yeast (candida) that is normally found in your vagina. With a yeast infection, the candida has overgrown in number to a point that upsets the chemical balance. SYMPTOMS   White, thick vaginal discharge.  Swelling, itching, redness and irritation of the vagina and possibly the lips of the vagina (vulva).  Burning or painful urination.  Painful intercourse. DIAGNOSIS  Things that may contribute to monilial vaginitis are:  Postmenopausal and virginal states.  Pregnancy.  Infections.  Being tired, sick or stressed, especially if you had monilial vaginitis in the past.  Diabetes. Good control will help lower the chance.  Birth control pills.  Tight fitting garments.  Using bubble bath, feminine sprays, douches or deodorant tampons.  Taking certain medications that kill germs (antibiotics).  Sporadic recurrence can occur if you become ill. TREATMENT  Your caregiver will give you medication.  There are several kinds of anti monilial vaginal creams and suppositories specific for monilial vaginitis. For recurrent yeast infections, use a suppository or cream in the vagina 2 times a week, or as directed.  Anti-monilial or steroid cream for the itching or irritation of the vulva may also be used. Get your caregiver's permission.  Painting the vagina with methylene blue solution may help if the monilial cream does not work.  Eating yogurt may help prevent monilial vaginitis. HOME CARE INSTRUCTIONS   Finish all medication as prescribed.  Do not have sex until treatment is completed or after your caregiver tells you it is okay.  Take warm sitz baths.  Do not douche.  Do not use tampons, especially scented ones.  Wear cotton underwear.  Avoid tight pants and panty hose.  Tell your sexual partner that you have a yeast infection. They should go to their caregiver if they have symptoms such as mild  rash or itching.  Your sexual partner should be treated as well if your infection is difficult to eliminate.  Practice safer sex. Use condoms.  Some vaginal medications cause latex condoms to fail. Vaginal medications that harm condoms are:  Cleocin cream.  Butoconazole (Femstat).  Terconazole (Terazol) vaginal suppository.  Miconazole (Monistat) (may be purchased over the counter). SEEK MEDICAL CARE IF:   You have a temperature by mouth above 102 F (38.9 C).  The infection is getting worse after 2 days of treatment.  The infection is not getting better after 3 days of treatment.  You develop blisters in or around your vagina.  You develop vaginal bleeding, and it is not your menstrual period.  You have pain when you urinate.  You develop intestinal problems.  You have pain with sexual intercourse.   This information is not intended to replace advice given to you by your health care provider. Make sure you discuss any questions you have with your health care provider.   Document Released: 08/25/2005 Document Revised: 02/07/2012 Document Reviewed: 05/19/2015 Elsevier Interactive Patient Education 2016 Elsevier Inc.   Round Ligament Pain The round ligament is a cord of muscle and tissue that helps to support the uterus. It can become a source of pain during pregnancy if it becomes stretched or twisted as the baby grows. The pain usually begins in the second trimester of pregnancy, and it can come and go until the  baby is delivered. It is not a serious problem, and it does not cause harm to the baby. Round ligament pain is usually a short, sharp, and pinching pain, but it can also be a dull, lingering, and aching pain. The pain is felt in the lower side of the abdomen or in the groin. It usually starts deep in the groin and moves up to the outside of the hip area. Pain can occur with:  A sudden change in position.  Rolling over in bed.  Coughing or  sneezing.  Physical activity. HOME CARE INSTRUCTIONS Watch your condition for any changes. Take these steps to help with your pain:  When the pain starts, relax. Then try:  Sitting down.  Flexing your knees up to your abdomen.  Lying on your side with one pillow under your abdomen and another pillow between your legs.  Sitting in a warm bath for 15-20 minutes or until the pain goes away.  Take over-the-counter and prescription medicines only as told by your health care provider.  Move slowly when you sit and stand.  Avoid long walks if they cause pain.  Stop or lessen your physical activities if they cause pain. SEEK MEDICAL CARE IF:  Your pain does not go away with treatment.  You feel pain in your back that you did not have before.  Your medicine is not helping. SEEK IMMEDIATE MEDICAL CARE IF:  You develop a fever or chills.  You develop uterine contractions.  You develop vaginal bleeding.  You develop nausea or vomiting.  You develop diarrhea.  You have pain when you urinate.   This information is not intended to replace advice given to you by your health care provider. Make sure you discuss any questions you have with your health care provider.   Document Released: 08/24/2008 Document Revised: 02/07/2012 Document Reviewed: 01/22/2015 Elsevier Interactive Patient Education 2016 [employer]    [email or web address] [address] [place], [place] [phone]    [facility] [email or web address] [address] [place], [place] [phone]    [facility] [address] [place], [place] [phone]   [facility] [email or web address] [address] [place], [place] [address] [phone]   [facility] [email or web address] [address] [place], [place] [phone]   [facility] [email or web address] [address] [place], [place] [phone]

## 2016-03-17 NOTE — ED Notes (Signed)
OB rapid response nurse called at this time and stated pt can be taken off the monitor. MD Ward notified.

## 2016-03-18 LAB — GC/CHLAMYDIA PROBE AMP (~~LOC~~) NOT AT ARMC
Chlamydia: NEGATIVE
Neisseria Gonorrhea: NEGATIVE

## 2016-10-23 ENCOUNTER — Encounter (HOSPITAL_COMMUNITY): Payer: Self-pay

## 2017-09-27 IMAGING — US US RENAL
1 series · 14 of 24 positions shown · non-contrast
Comparison: None.

CLINICAL DATA: Left lower quadrant pain.  Nineteen weeks pregnant.

EXAM:
RENAL / URINARY TRACT ULTRASOUND COMPLETE

[Series 1: us renal · 0.25mm/px · 14 of 24 slices shown]
[im 1/24]
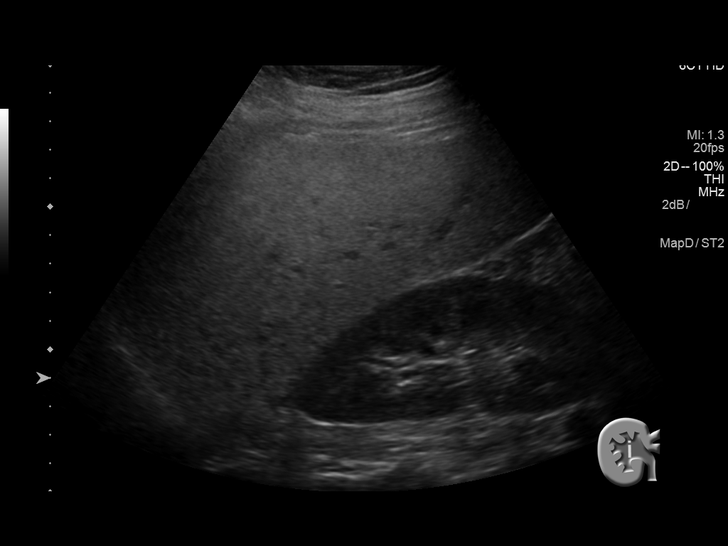
[im 3/24]
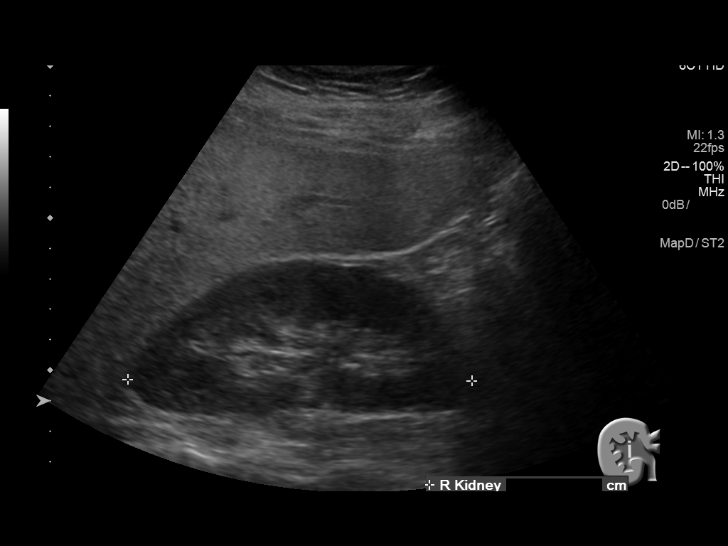
[im 5/24]
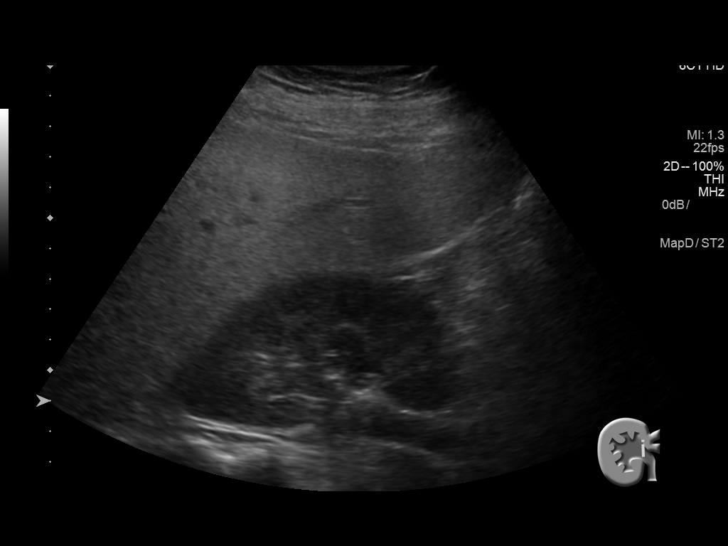
[im 7/24]
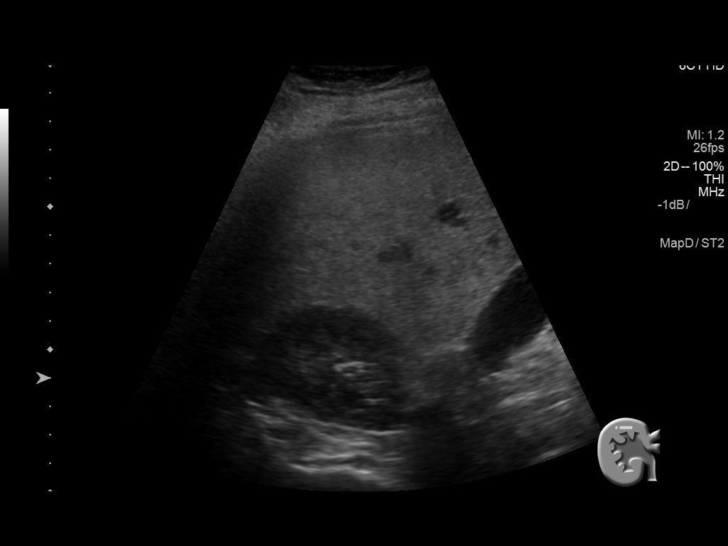
[im 8/24]
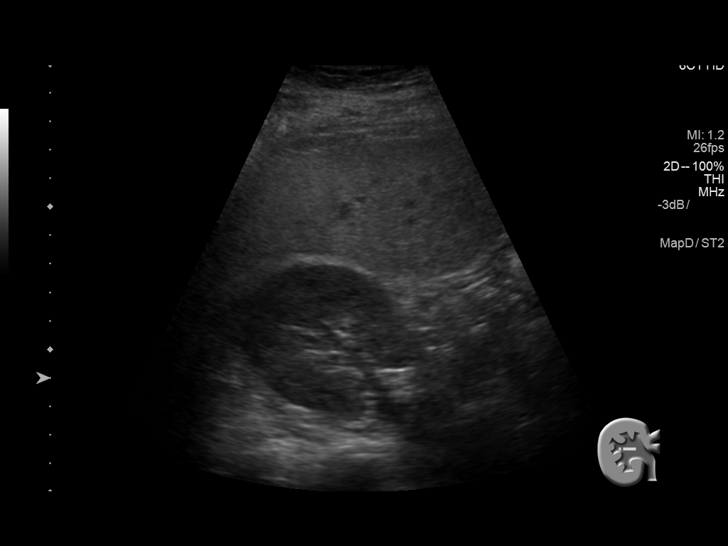
[im 10/24]
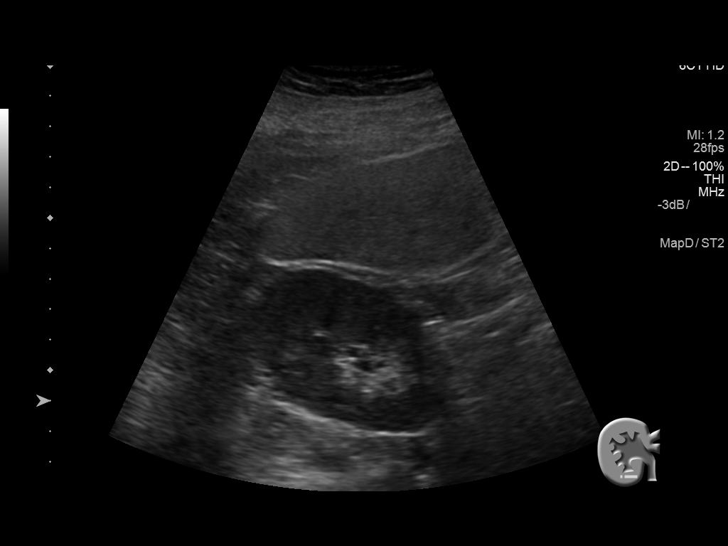
[im 12/24]
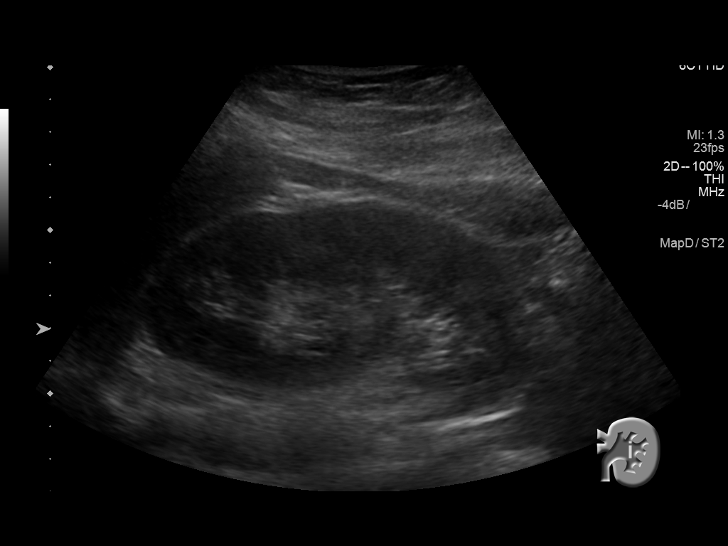
[im 13/24]
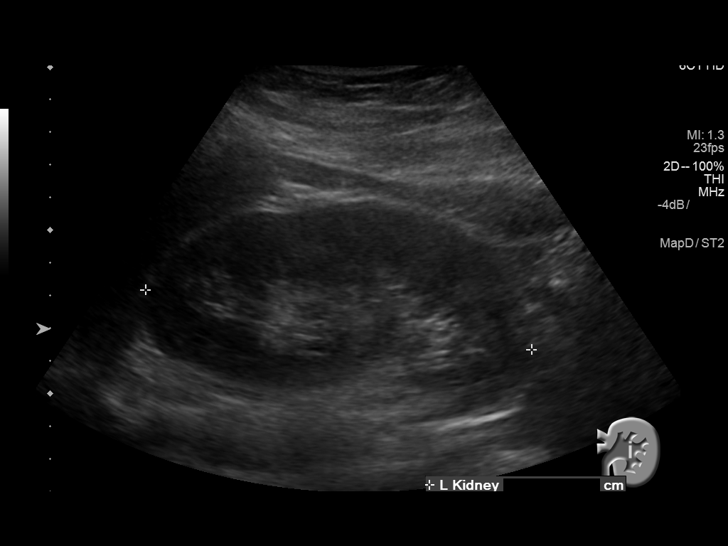
[im 15/24]
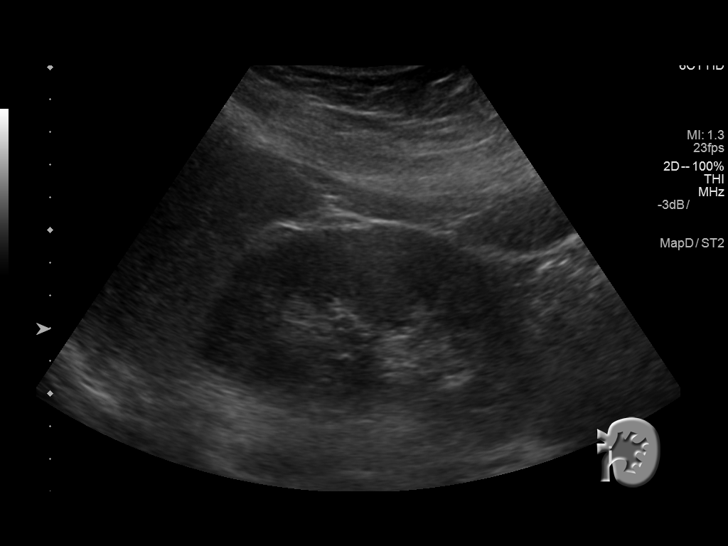
[im 17/24]
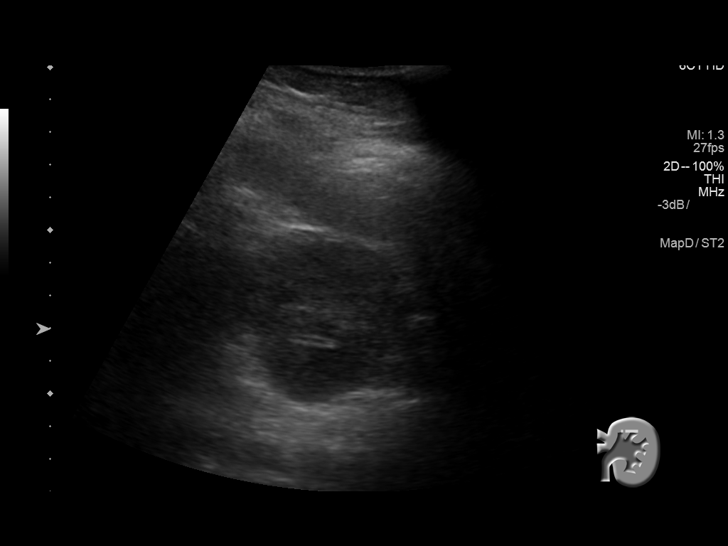
[im 19/24]
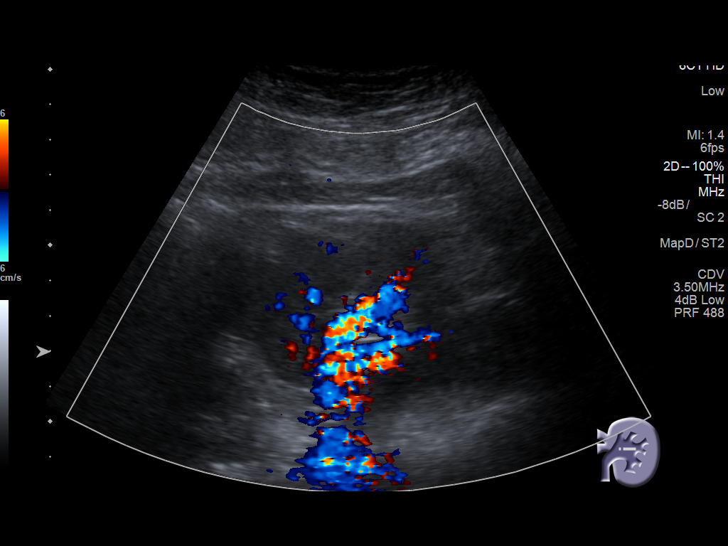
[im 20/24]
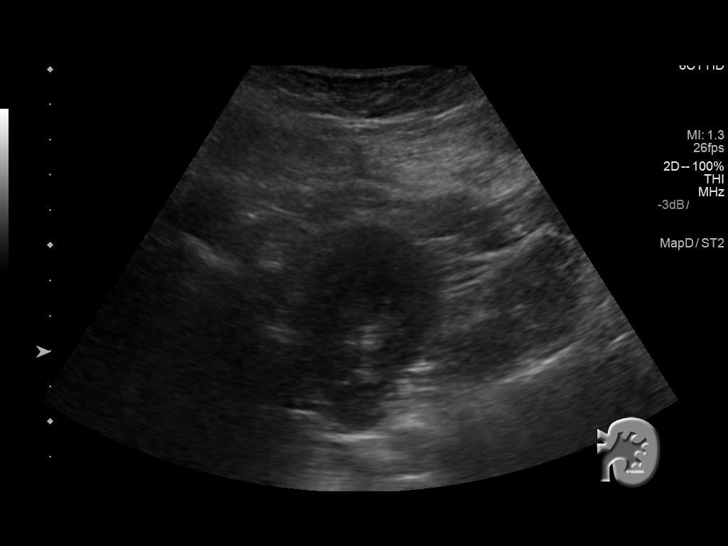
[im 22/24]
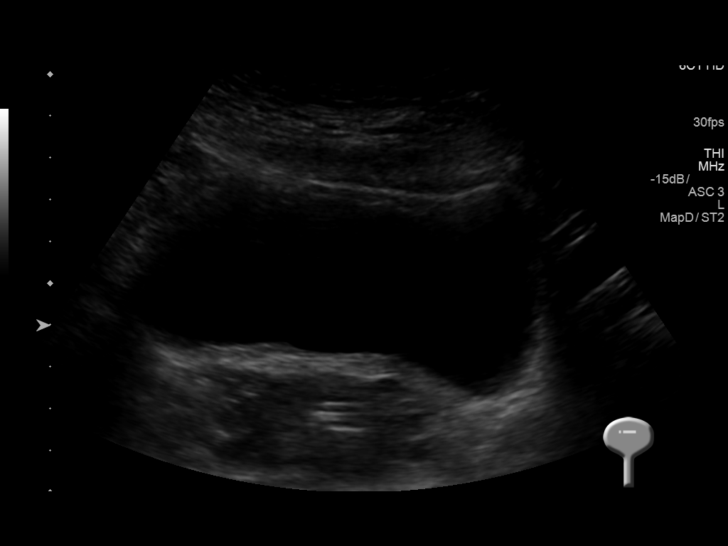
[im 24/24]
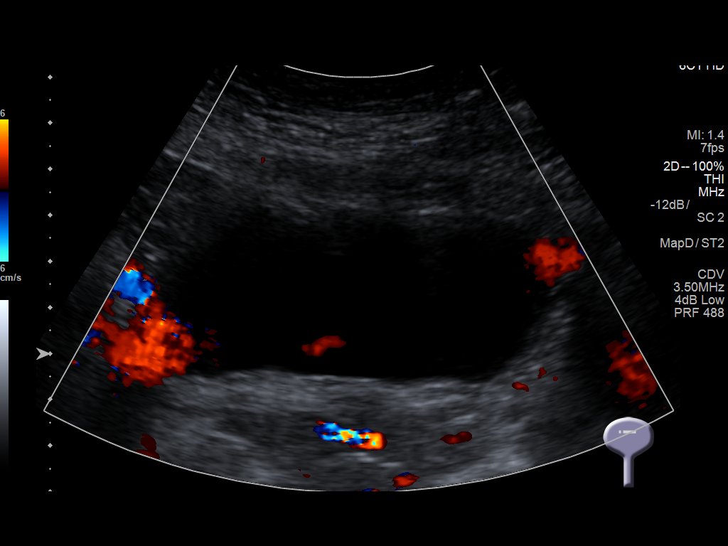

[14 of 24 positions shown; findings below may reference images not displayed]

FINDINGS: Right Kidney:

Length: 11.3 cm. Echogenicity within normal limits. No mass or
hydronephrosis visualized.

Left Kidney:

Length: 12 cm. Echogenicity within normal limits. No mass or
hydronephrosis visualized.

Bladder:

Appears normal for degree of bladder distention. Both distal ureters
are shown to be patent at the level of the bladder (bilateral
ureteral jets visualized).

Visualized portions of the liver appear diffusely echogenic
consistent with fatty infiltration.
IMPRESSION: Normal renal ultrasound.

Fatty infiltration of the liver incidentally noted.

## 2017-10-11 IMAGING — US US RENAL
1 series · 14 of 25 positions shown · non-contrast
Comparison: 12/05/2015

CLINICAL DATA: Pain with voiding.

EXAM:
RENAL / URINARY TRACT ULTRASOUND COMPLETE

[Series 1: us renal · 0.28mm/px · 14 of 29 slices shown]
[im 1/29]
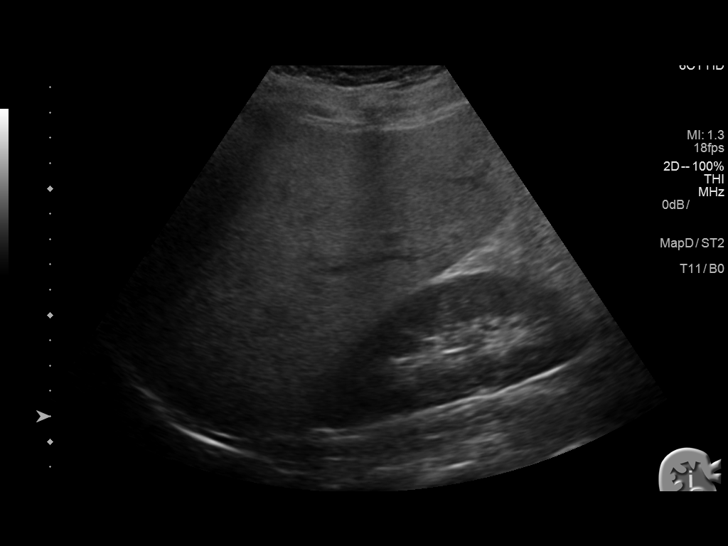
[im 3/29]
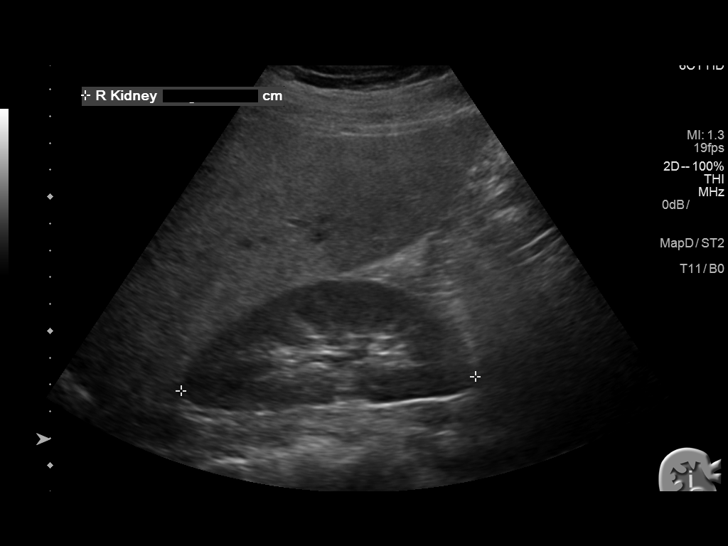
[im 5/29]
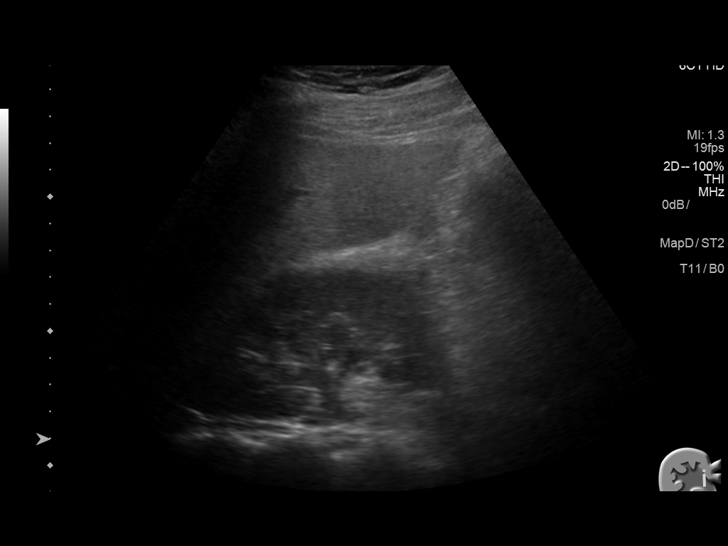
[im 8/29]
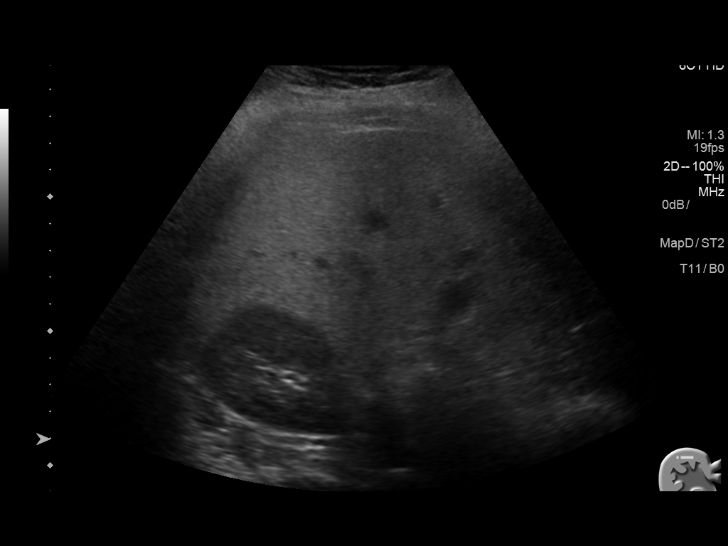
[im 10/29]
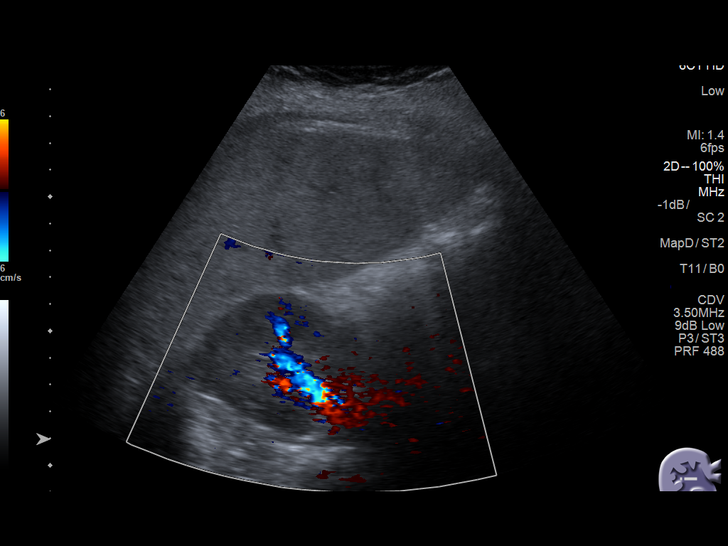
[im 11/29]
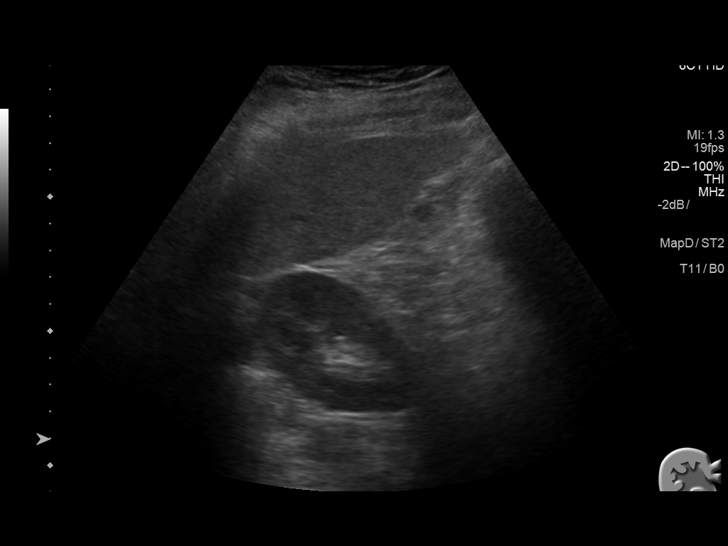
[im 13/29]
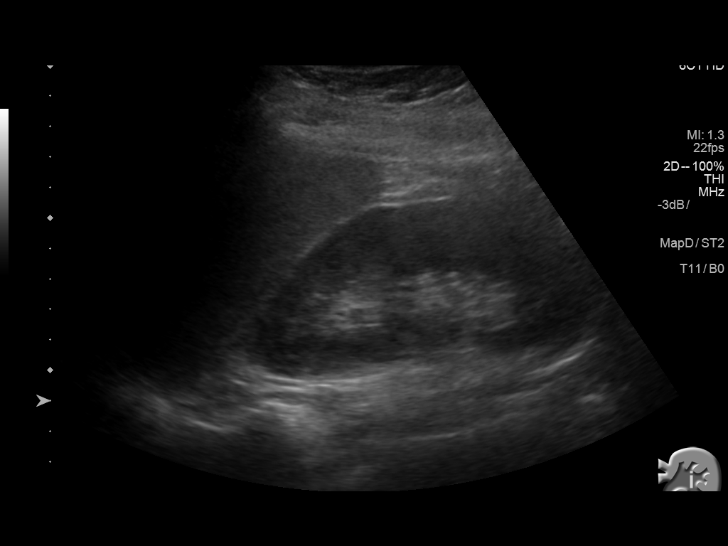
[im 16/29]
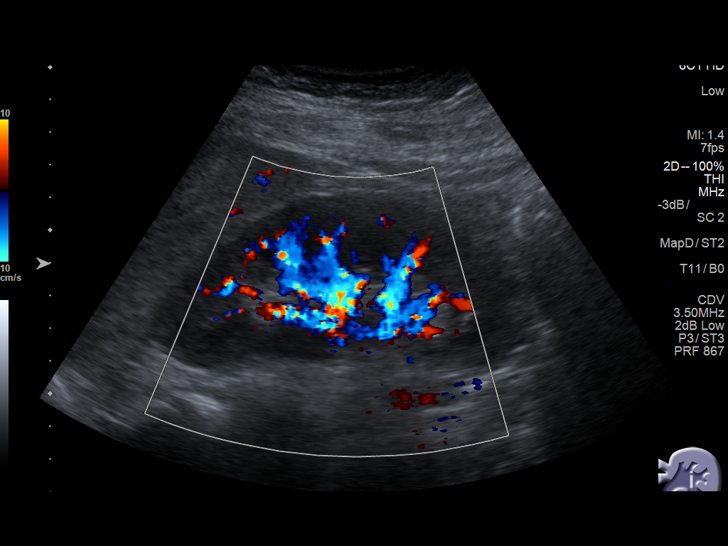
[im 18/29]
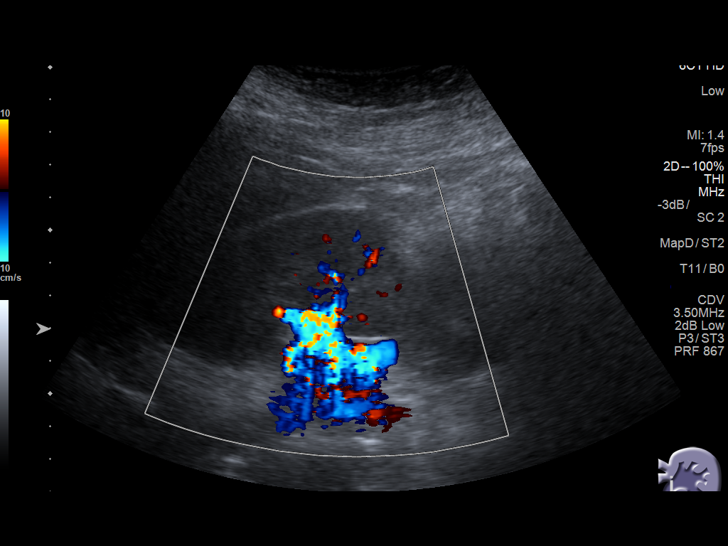
[im 19/29]
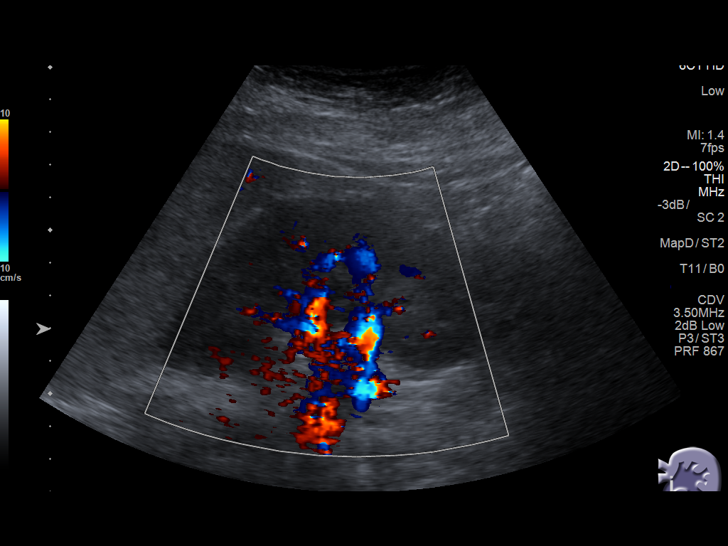
[im 22/29]
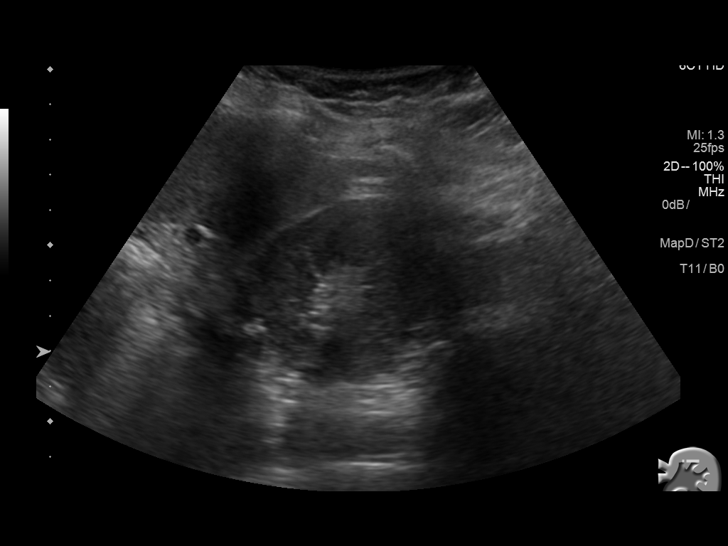
[im 24/29]
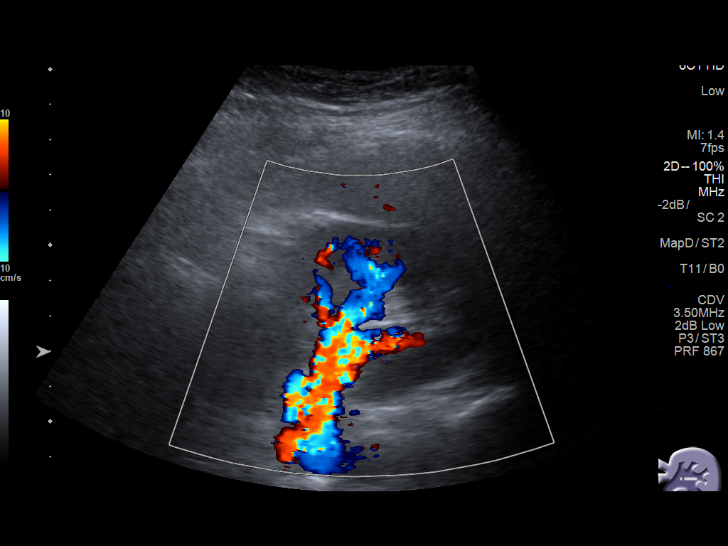
[im 26/29]
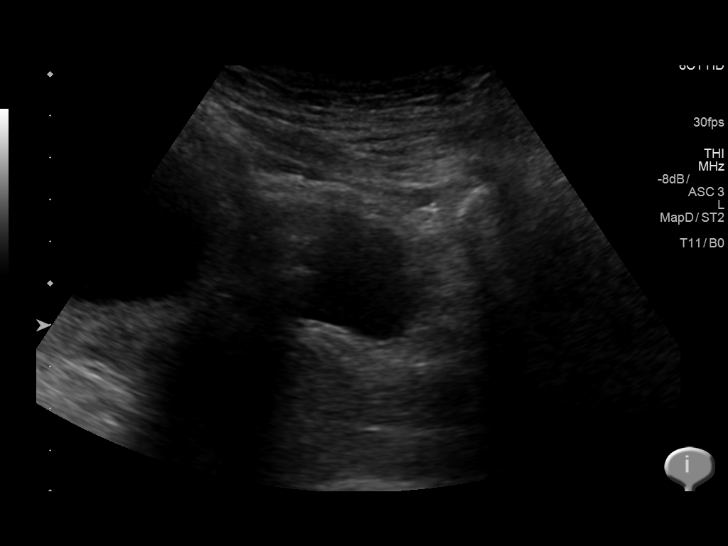
[im 29/29]
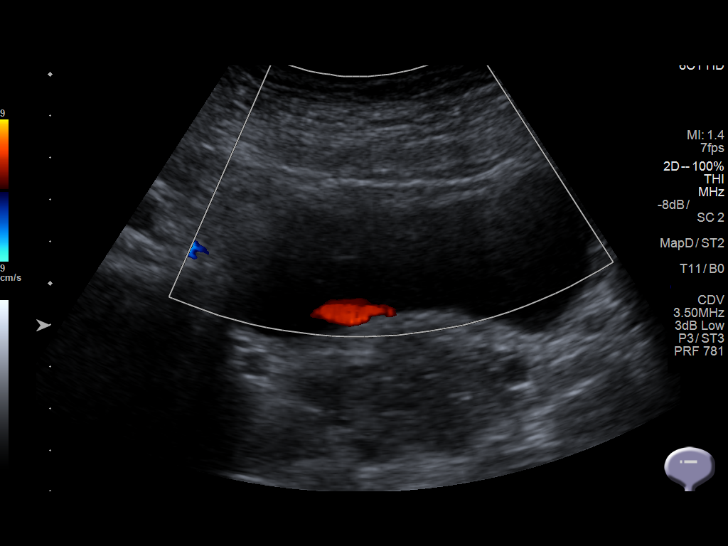

[14 of 25 positions shown; findings below may reference images not displayed]

FINDINGS: Right Kidney:

Length: 11.0. Echogenicity within normal limits. No mass or
hydronephrosis visualized.

Left Kidney:

Length: 11.6. Echogenicity within normal limits. No mass or
hydronephrosis visualized.

Bladder:

Appears normal for degree of bladder distention. Bilateral ureteral
jets are noted.
IMPRESSION: Normal renal ultrasound.

## 2018-01-18 IMAGING — DX DG CHEST 2V
2 series · 2 of 2 positions shown · non-contrast
Comparison: None.

CLINICAL DATA: Cough and congestion for a few days.

EXAM:
CHEST  2 VIEW

[chest pa]
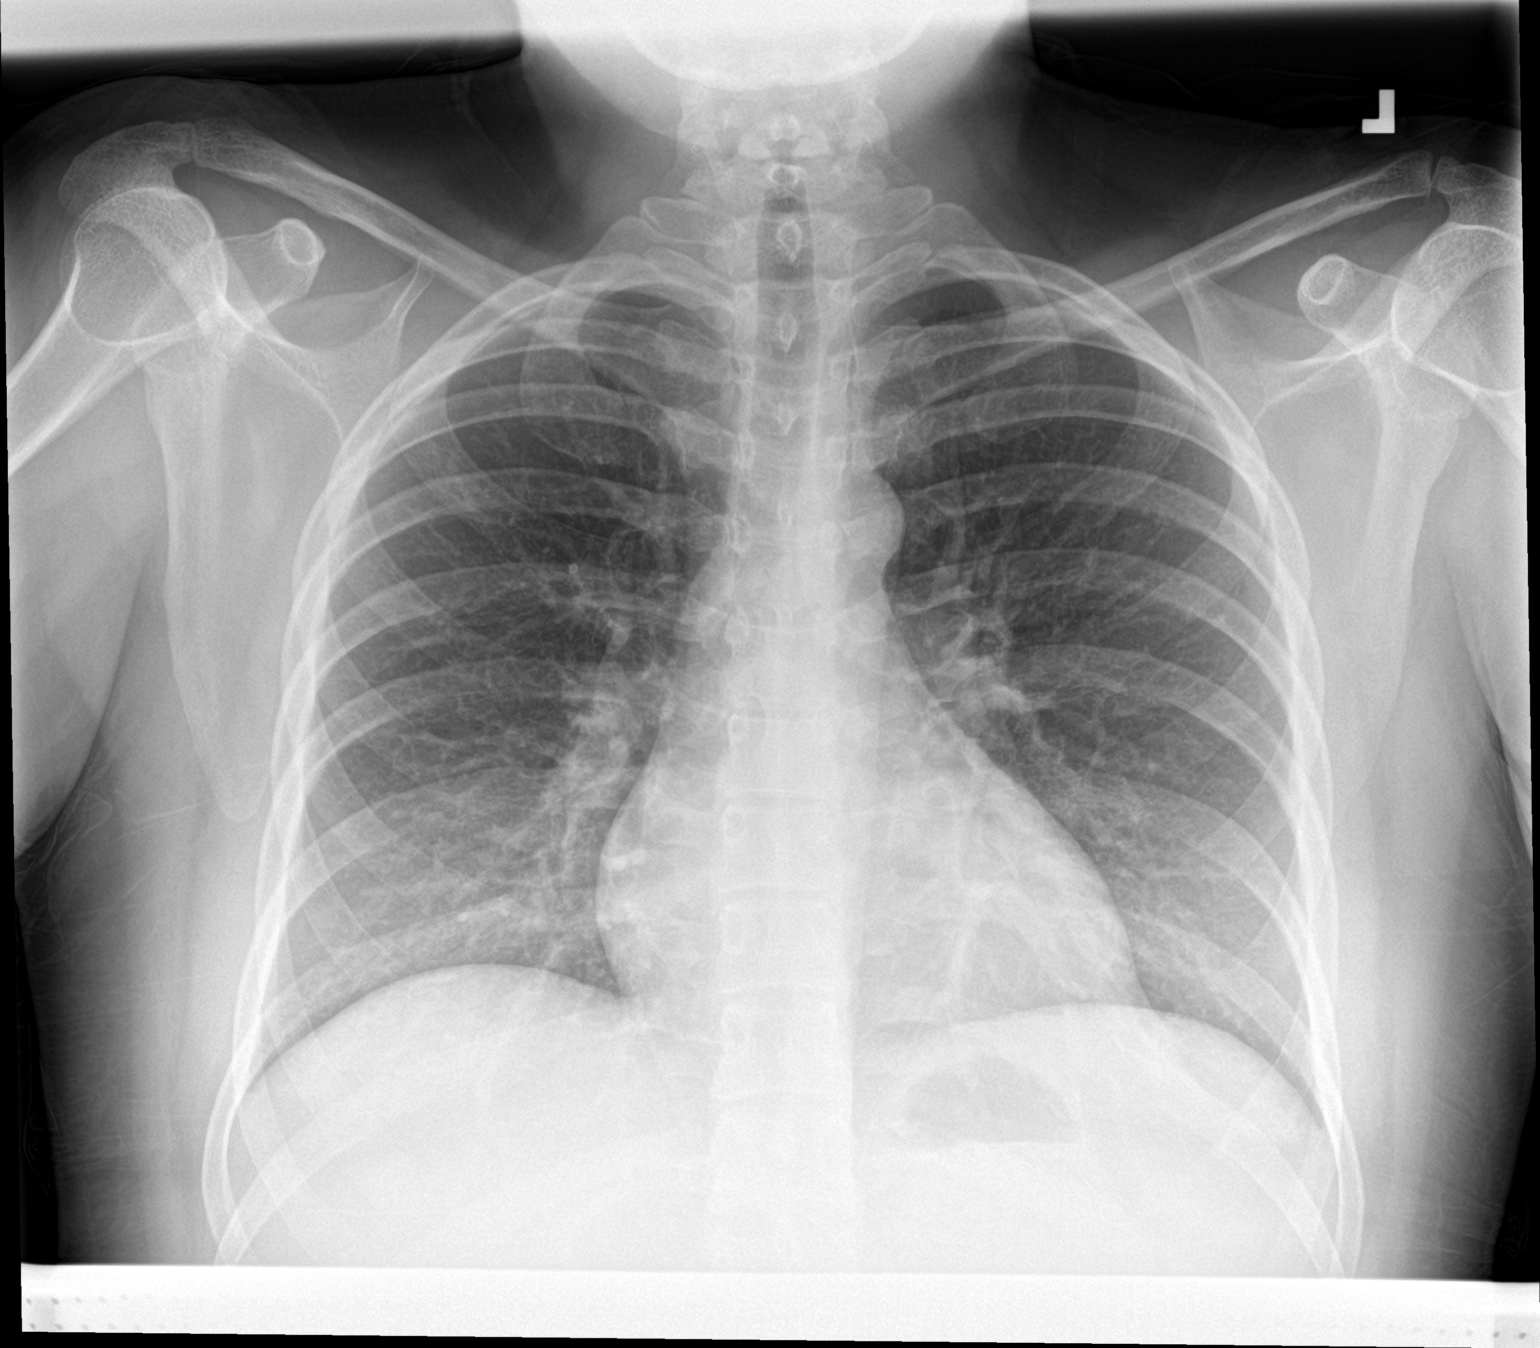

[chest lat]
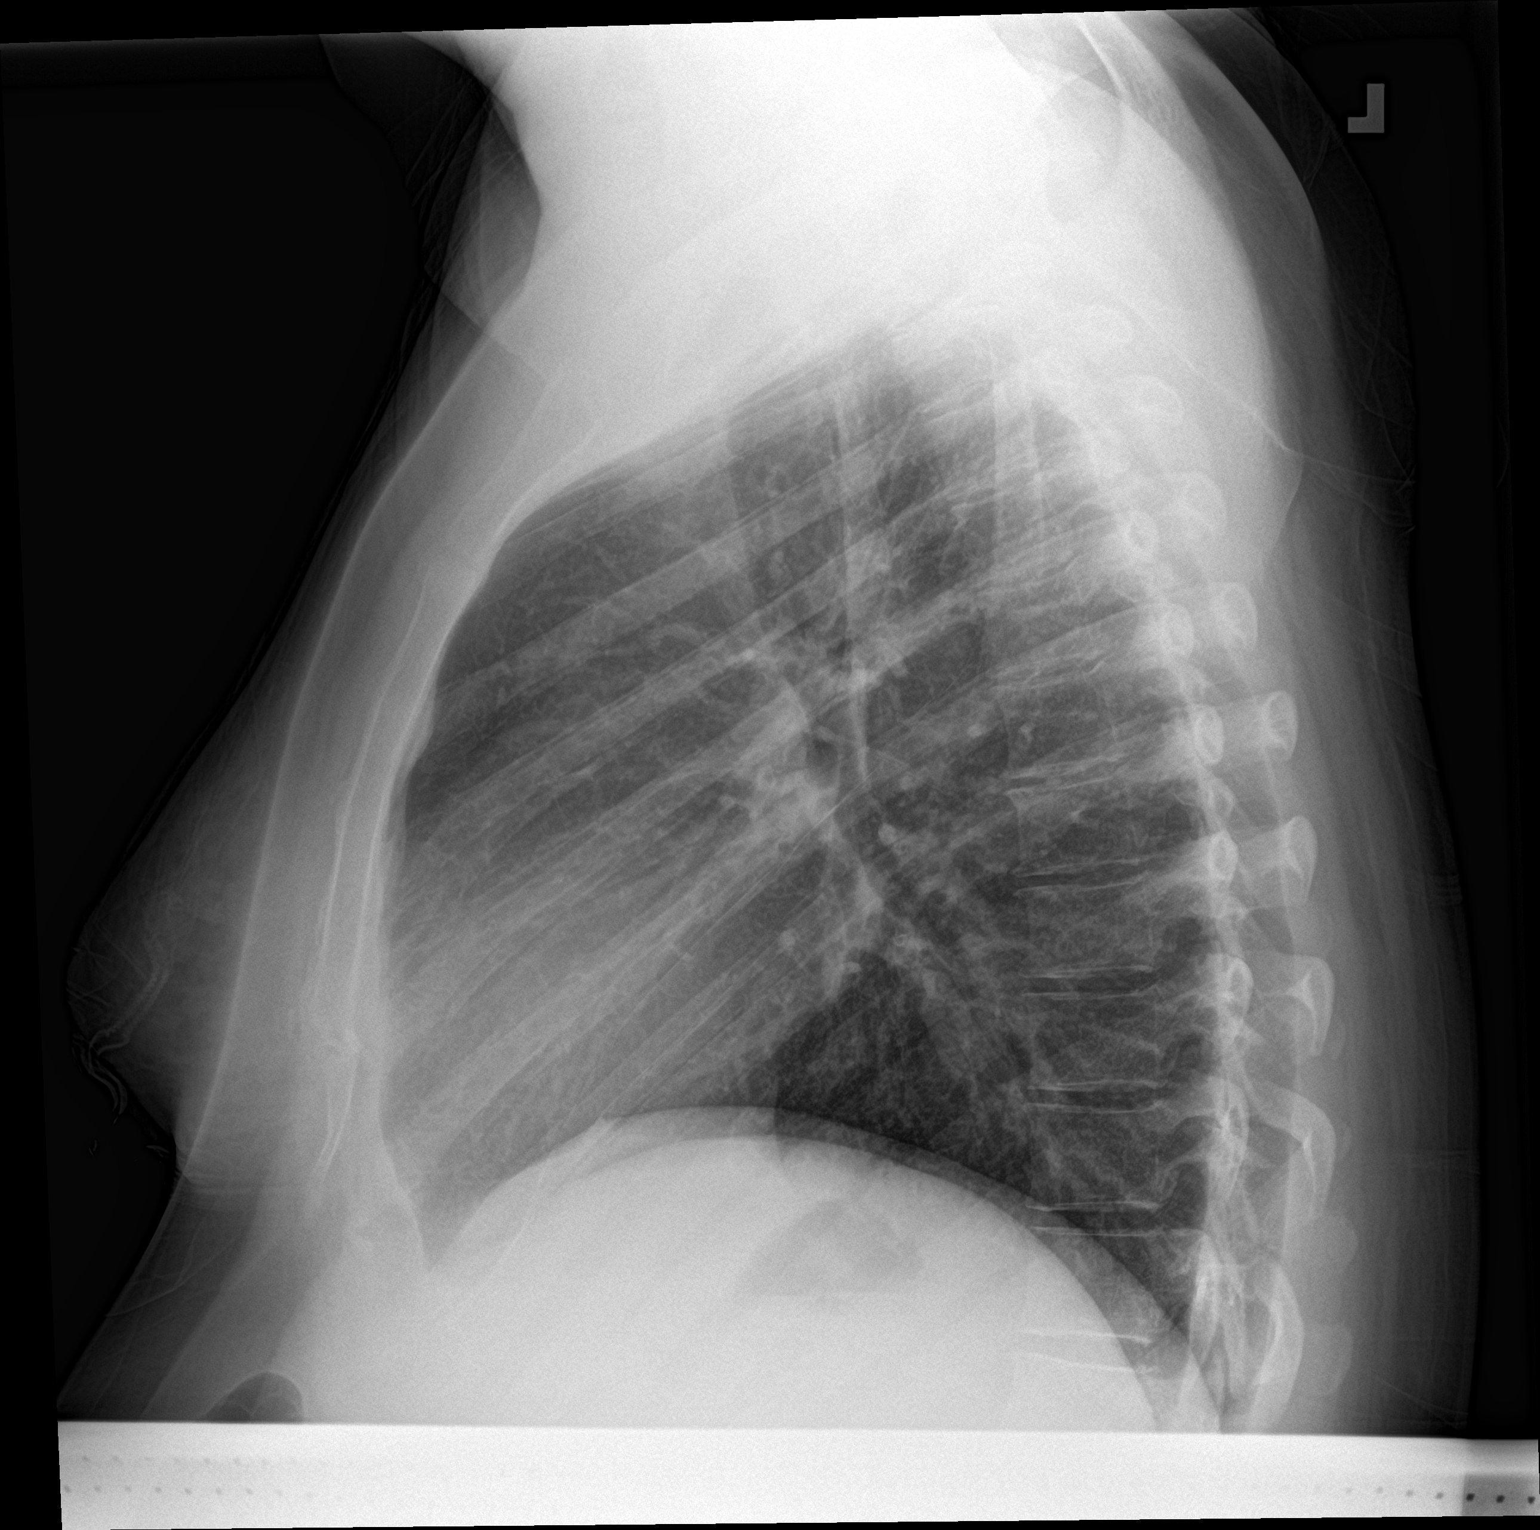

[2 of 2 positions shown; findings below may reference images not displayed]

FINDINGS: The heart size and mediastinal contours are within normal limits.
Both lungs are clear. The visualized skeletal structures are
unremarkable.
IMPRESSION: No active cardiopulmonary disease.

## 2019-06-18 DIAGNOSIS — N764 Abscess of vulva: Secondary | ICD-10-CM

## 2019-06-18 NOTE — ED Provider Notes (Signed)
ED Provider Notes by Vivi MartensSahim, Adante Courington, PA at 06/18/19 2143                Author: Vivi MartensSahim, Vlasta Baskin, PA  Service: Emergency Medicine  Author Type: Physician Assistant       Filed: 06/19/19 0650  Date of Service: 06/18/19 2143  Status: Attested           Editor: Vivi MartensSahim, Geoffrey Hynes, PA (Physician Assistant)  Cosigner: Christiana PellantFickenscher, Ben A, MD at 06/26/19 1048            Procedure Orders        1. I&D Abcess Complex [[id number]][[id number]] ordered by Vivi MartensSahim, Jermell Holeman, PA                         Attestation signed by Christiana PellantFickenscher, Ben A, MD at 06/26/19 1048          I have discussed the patient's presentation with the APP.  Based upon their report of the patient's complaint(s), pertinent history, pertinent exam, and testing  results, I agree with their assessment and management plan.  I have been available for consultation throughout the patient's stay in the Emergency Department.                                    [facility]   Emergency Department Treatment Report          Patient: Kara Bennett  Age: [age] y.o.  Sex: female          Date of Birth: 01/24/1986  Admit Date: 06/18/2019  PCP: Eugenie BirksGiguiere, Thomas V, MD         MRN: [id number]   CSN: [id number]   Attending: Dr. Dixie DialsBen Fickenscher         Room: EL38/BO17R45/ER45  Time Dictated: 6:45 AM  APP: Charelle Petrakis        Chief Complaint      Chief Complaint       Patient presents with        ?  Abscess             History of Present Illness     33 y.o. female  presents to the emergency department with a painful bump on her left labia that started about 6 days ago and has become progressively worse.  She is now an 8 out of 10 constant aching pain she denies any drainage.  She tried a warm compress which offered  no relief.  There is very swollen and extremely tender to the touch.  She does not currently have an OB/GYN.  She denies having symptoms like this in the past.        Review of Systems     Constitutional: No fever, chills, or weight loss.   Respiratory: No cough, dyspnea or wheezing.    Cardiovascular: No chest pain, pressure, palpitations, tightness or heaviness.   Gastrointestinal: No vomiting, diarrhea or abdominal pain.   Genitourinary: No dysuria, frequency, or urgency.  Labial abscess.   Musculoskeletal: No joint pain or swelling.  No myalgias   Integumentary: No rashes.   Neurological: No headaches, sensory or motor symptoms.        Past Medical/Surgical History     History reviewed. No pertinent past medical history.   History reviewed. No pertinent surgical history.        Social History  Social History          Socioeconomic History         ?  Marital status:  MARRIED              Spouse name:  Not on file         ?  Number of children:  Not on file     ?  Years of education:  Not on file     ?  Highest education level:  Not on file       Tobacco Use         ?  Smoking status:  Current Every Day Smoker              Packs/day:  0.50         ?  Smokeless tobacco:  Never Used             Family History     History reviewed. No pertinent family history.        Home Medications          None             Allergies     No Known Allergies        Physical Exam          ED Triage Vitals     ED Encounter Vitals Group            BP  06/18/19 2019  123/82        Pulse (Heart Rate)  06/18/19 2019  (!) 104        Resp Rate  06/18/19 2019  14        Temp  06/18/19 2019  99 ??F (37.2 ??C)        Temp src  --          O2 Sat (%)  06/18/19 2019  100 %        Weight  06/18/19 2006  145 lb            Height  06/18/19 2006  5\' 3"         Constitutional: Patient appears well developed and well nourished.  Appearance and behavior are age and situation appropriate.   Patient appears to be in significant pain.   HEENT: Conjunctivae clear.  PERRL.    Respiratory: lungs clear to auscultation, nonlabored respirations. No tachypnea or accessory muscle use.   Cardiovascular: heart regular rate and rhythm without murmur rubs or gallops.    Gastrointestinal:  Abdomen soft, nontender without complaint of pain to  palpation. No guarding, distension or rigidity.    GU: Exam performed in the presence of ED nurse Robynn PaneElise.  Erythematous and edematous left labia with central fluctuance with surrounding induration  extremely tender with palpation   Musculoskeletal: Normal ROM, non-tender.  No obvious deformities, moves all extremities without difficulty   Integumentary: Fluctuant abscess to the left labia extremely tender with palpation.   Neurologic: alert and oriented, Sensation intact, motor strength equal and symmetric.     Impression and Management Plan     Is a 33 year old female presenting to the emergency department with what appears to be a labial abscess.  Will perform I&D and discharge home with antibiotics due  to the cellulitis.        Diagnostic Studies     Lab:    No results found for this or any previous visit (from the past 12 hour(s)).  Imaging:     No results found.           ED Course/Medical Decision Making     Patient remained stable throughout her stay here in the emergency department.  She had a large labial abscess with surrounding cellulitis.  I&D was performed and  the patient tolerated with some difficulty.  A moderate amount of purulent discharge was removed from the abscess abscess was packed.  Due to the surrounding cellulitis patient was discharged home with Bactrim.  Patient was having exquisite pain and she  was given a prescription for a few Norco's to go home.  She is encouraged to return if she develops any progression of her symptoms.  She should remove the packing in 2 days she may return here if she is uncomfortable doing so herself.  We also recommended  anti-inflammatories as well as warm sits baths to help with the symptoms.  Patient understood and agreed with the treatment plan.       I&D Abcess Complex     Date/Time: 06/19/2019 6:49 AM   Performed by:  Royann Shivers, PA   Authorized by:  Royann Shivers, [place]       Consent:      Consent obtained:  Verbal     Consent given by:  Patient      Risks discussed:  Bleeding and pain   Location:      Type:  Abscess     Location:  Anogenital     Anogenital location: Labial.   Pre-procedure details:      Skin preparation:  Betadine   Anesthesia (see MAR for exact dosages):      Anesthesia method:  Local infiltration     Local anesthetic:  Lidocaine 2% WITH epi   Procedure type:      Complexity:  Complex   Procedure details:      Incision types:  Single straight     Incision depth:  Subcutaneous     Scalpel blade:  11     Wound management:  Probed and deloculated and irrigated with saline     Drainage:  Purulent     Drainage amount:  Moderate     Wound treatment:  Wound left open     Packing materials:  1/2 in iodoform gauze   Post-procedure details:      Patient tolerance of procedure:  Tolerated with difficulty            Medications - No data to display     Final Diagnosis                 ICD-10-CM  ICD-9-CM          1.  Labial pain   N94.89  625.9          2.  Labial abscess   N76.4  616.4          Disposition     Discharged home in stable condition.     Discharge Medication List as of 06/18/2019  9:21 PM              START taking these medications          Details        HYDROcodone-acetaminophen (Norco) 5-325 mg per tablet  Take 1 Tab by mouth every four (4) hours as needed for Pain for up to 3 days. Max Daily Amount: 6 Tabs., Print, Disp-6 Tab,R-0  trimethoprim-sulfamethoxazole (Bactrim DS) 160-800 mg per tablet  Take 1 Tab by mouth two (2) times a day for 7 days., Print, Disp-14 Tab,R-0                         The patient was personally evaluated by myself and discussed with Dr. Dixie DialsBen Fickenscher who agrees with the above assessment and plan         Vivi MartensKatie Sony Schlarb PA-C   June 19, 2019      My signature above authenticates this document and my orders, the final ??   diagnosis (es), discharge prescription (s), and instructions in the Epic ??   record.   If you have any questions please contact [phone].   ??   Nursing notes have been reviewed by  the physician/ advanced practice ??   Clinician.      Dragon medical dictation software was used for portions of this report. Unintended voice recognition errors may occur.

## 2019-06-18 NOTE — ED Notes (Signed)
9:43 PM  06/18/19     Discharge instructions given to PATIENT (name) with verbalization of understanding. Patient accompanied by SELF.  Patient discharged with the following prescriptions BACTRIM, NORCO. Patient discharged to HOME (destination).      Rowe Robert, RN

## 2019-06-18 NOTE — ED Notes (Signed)
C/o abscess on vaginal area since 6 days ago, no drainage, with tenderness on L groin area

## 2019-06-18 NOTE — ED Provider Notes (Signed)
[place]  Emergency Department Treatment Report    Patient: Kara Bennett Age: 33 y.o. Sex: female    Date of Birth: 1986-02-13 Admit Date: 06/18/2019 PCP: Ortencia Kick, MD   MRN: [id number]  CSN: [id number]  Attending: Dr. Christianne Borrow   Room: ZY60/YT01 Time Dictated: 6:45 AM APP: Kamiyah Kindel     Chief Complaint   Chief Complaint   Patient presents with   ??? Abscess       History of Present Illness   33 y.o. female presents to the emergency department with a painful bump on her left labia that started about 6 days ago and has become progressively worse.  She is now an 8 out of 10 constant aching pain she denies any drainage.  She tried a warm compress which offered no relief.  There is very swollen and extremely tender to the touch.  She does not currently have an OB/GYN.  She denies having symptoms like this in the past.    Review of Systems   Constitutional: No fever, chills, or weight loss.  Respiratory: No cough, dyspnea or wheezing.  Cardiovascular: No chest pain, pressure, palpitations, tightness or heaviness.  Gastrointestinal: No vomiting, diarrhea or abdominal pain.  Genitourinary: No dysuria, frequency, or urgency.  Labial abscess.  Musculoskeletal: No joint pain or swelling.  No myalgias  Integumentary: No rashes.  Neurological: No headaches, sensory or motor symptoms.    Past Medical/Surgical History   History reviewed. No pertinent past medical history.  History reviewed. No pertinent surgical history.    Social History     Social History     Socioeconomic History   ??? Marital status: MARRIED     Spouse name: Not on file   ??? Number of children: Not on file   ??? Years of education: Not on file   ??? Highest education level: Not on file   Tobacco Use   ??? Smoking status: Current Every Day Smoker     Packs/day: 0.50   ??? Smokeless tobacco: Never Used       Family History   History reviewed. No pertinent family history.    Home Medications     None       Allergies   No Known Allergies     Physical Exam     ED Triage Vitals   ED Encounter Vitals Group      BP 06/18/19 2019 123/82      Pulse (Heart Rate) 06/18/19 2019 (!) 104      Resp Rate 06/18/19 2019 14      Temp 06/18/19 2019 99 ??F (37.2 ??C)      Temp src --       O2 Sat (%) 06/18/19 2019 100 %      Weight 06/18/19 2006 145 lb      Height 06/18/19 2006 5\' 3"      Constitutional: Patient appears well developed and well nourished.  Appearance and behavior are age and situation appropriate.  Patient appears to be in significant pain.  HEENT: Conjunctivae clear.  PERRL.   Respiratory: lungs clear to auscultation, nonlabored respirations. No tachypnea or accessory muscle use.  Cardiovascular: heart regular rate and rhythm without murmur rubs or gallops.   Gastrointestinal:  Abdomen soft, nontender without complaint of pain to palpation. No guarding, distension or rigidity.   GU: Exam performed in the presence of ED nurse Daneil Dan.  Erythematous and edematous left labia with central fluctuance with surrounding induration extremely tender with palpation  Musculoskeletal:  Normal ROM, non-tender.  No obvious deformities, moves all extremities without difficulty  Integumentary: Fluctuant abscess to the left labia extremely tender with palpation.  Neurologic: alert and oriented, Sensation intact, motor strength equal and symmetric.  Impression and Management Plan   Is a 33 year old female presenting to the emergency department with what appears to be a labial abscess.  Will perform I&D and discharge home with antibiotics due to the cellulitis.    Diagnostic Studies   Lab:   No results found for this or any previous visit (from the past 12 hour(s)).    Imaging:    No results found.      ED Course/Medical Decision Making   Patient remained stable throughout her stay here in the emergency department.  She had a large labial abscess with surrounding cellulitis.  I&D was performed and the patient tolerated with some difficulty.  A  moderate amount of purulent discharge was removed from the abscess abscess was packed.  Due to the surrounding cellulitis patient was discharged home with Bactrim.  Patient was having exquisite pain and she was given a prescription for a few Norco's to go home.  She is encouraged to return if she develops any progression of her symptoms.  She should remove the packing in 2 days she may return here if she is uncomfortable doing so herself.  We also recommended anti-inflammatories as well as warm sits baths to help with the symptoms.  Patient understood and agreed with the treatment plan.    I&D Abcess Complex    Date/Time: 06/19/2019 6:49 AM  Performed by: Vivi MartensSahim, Harika Laidlaw, PA  Authorized by: Vivi MartensSahim, [place]ahanna Almendariz, [place]     Consent:     Consent obtained:  Verbal    Consent given by:  Patient    Risks discussed:  Bleeding and pain  Location:     Type:  Abscess    Location:  Anogenital    Anogenital location: Labial.  Pre-procedure details:     Skin preparation:  Betadine  Anesthesia (see MAR for exact dosages):     Anesthesia method:  Local infiltration    Local anesthetic:  Lidocaine 2% WITH epi  Procedure type:     Complexity:  Complex  Procedure details:     Incision types:  Single straight    Incision depth:  Subcutaneous    Scalpel blade:  11    Wound management:  Probed and deloculated and irrigated with saline    Drainage:  Purulent    Drainage amount:  Moderate    Wound treatment:  Wound left open    Packing materials:  1/2 in iodoform gauze  Post-procedure details:     Patient tolerance of procedure:  Tolerated with difficulty        Medications - No data to display  Final Diagnosis       ICD-10-CM ICD-9-CM   1. Labial pain  N94.89 625.9   2. Labial abscess  N76.4 616.4     Disposition   Discharged home in stable condition.  Discharge Medication List as of 06/18/2019  9:21 PM      START taking these medications    Details   HYDROcodone-acetaminophen (Norco) 5-325 mg per tablet Take 1 Tab by mouth  every four (4) hours as needed for Pain for up to 3 days. Max Daily Amount: 6 Tabs., Print, Disp-6 Tab,R-0      trimethoprim-sulfamethoxazole (Bactrim DS) 160-800 mg per tablet Take 1 Tab by mouth two (2) times a day for 7 days.,  Print, Disp-14 Tab,R-0             The patient was personally evaluated by myself and discussed with Dr. Dixie DialsBen Fickenscher who agrees with the above assessment and plan      Vivi MartensKatie Pegge Cumberledge PA-C  June 19, 2019    My signature above authenticates this document and my orders, the final ??  diagnosis (es), discharge prescription (s), and instructions in the Epic ??  record.  If you have any questions please contact [phone].  ??  Nursing notes have been reviewed by the physician/ advanced practice ??  Clinician.    Dragon medical dictation software was used for portions of this report. Unintended voice recognition errors may occur.

## 2019-06-18 NOTE — ED Triage Notes (Signed)
C/o abscess on vaginal area since 6 days ago, no drainage, with tenderness on L groin area

## 2019-06-18 NOTE — ED Notes (Signed)
9:43 PM  06/18/19     Discharge instructions given to PATIENT (name) with verbalization of understanding. Patient accompanied by SELF.  Patient discharged with the following prescriptions BACTRIM, NORCO. Patient discharged to HOME (destination).      Elise M Keefe, RN

## 2019-06-19 ENCOUNTER — Inpatient Hospital Stay
Admit: 2019-06-19 | Discharge: 2019-06-19 | Disposition: A | Discharge status: Home / Self Care | Payer: MEDICAID | Attending: Emergency Medicine

## 2019-06-19 MED ORDER — TRIMETHOPRIM-SULFAMETHOXAZOLE 160 MG-800 MG TAB
160-800 mg | ORAL_TABLET | Freq: Two times a day (BID) | ORAL | 0 refills | Status: AC
Start: 2019-06-19 — End: 2019-06-25

## 2019-06-19 MED ORDER — HYDROCODONE-ACETAMINOPHEN 5 MG-325 MG TAB
5-325 mg | ORAL_TABLET | ORAL | 0 refills | Status: AC | PRN
Start: 2019-06-19 — End: 2019-06-21
